# Patient Record
Sex: Female | Born: 1985 | Race: Black or African American | Hispanic: No | Marital: Married | Smoking: Never smoker
Health system: Southern US, Community
[De-identification: ages and names within clinical notes are randomized; demographics above are authoritative.]

## PROBLEM LIST (undated history)

## (undated) ENCOUNTER — Inpatient Hospital Stay (HOSPITAL_COMMUNITY): Payer: Self-pay

## (undated) DIAGNOSIS — D649 Anemia, unspecified: Secondary | ICD-10-CM

## (undated) DIAGNOSIS — E282 Polycystic ovarian syndrome: Secondary | ICD-10-CM

## (undated) HISTORY — DX: Anemia, unspecified: D64.9

## (undated) HISTORY — PX: NO PAST SURGERIES: SHX2092

---

## 2010-12-23 ENCOUNTER — Inpatient Hospital Stay (HOSPITAL_COMMUNITY)
Admission: AD | Admit: 2010-12-23 | Discharge: 2010-12-23 | Disposition: A | Discharge status: Home / Self Care | Payer: Self-pay | Source: Ambulatory Visit | Attending: Obstetrics & Gynecology | Admitting: Obstetrics & Gynecology

## 2010-12-23 ENCOUNTER — Encounter (HOSPITAL_COMMUNITY): Payer: Self-pay | Admitting: *Deleted

## 2010-12-23 DIAGNOSIS — N915 Oligomenorrhea, unspecified: Secondary | ICD-10-CM

## 2010-12-23 NOTE — ED Provider Notes (Signed)
History   Jordan Holt is a 25 y.o. year old G0P0 female who presents to [facility]U reporting oligomenorrhea x 2 years, LMP 10/06/10. Denies n/v, breast tenderness, Is sexually active w/out consistent birth control. Denies unexpected weight change, acne, hirsutism, Hx ovarian cysts.   CSN: [id number] Arrival date & time: 12/23/2010  2:32 PM   None     Chief Complaint  Patient presents with  . Possible Pregnancy    (Consider location/radiation/quality/duration/timing/severity/associated sxs/prior treatment) HPI  Past Medical History  Diagnosis Date  . No pertinent past medical history     Past Surgical History  Procedure Date  . No past surgeries     No family history on file.  History  Substance Use Topics  . Smoking status: Never Smoker   . Smokeless tobacco: Not on file  . Alcohol Use: No    OB History    Grav Para Term Preterm Abortions TAB SAB Ect Mult Living   0               Review of Systems: Otherwise neg  Allergies  Review of patient's allergies indicates no known allergies.  Home Medications  No current outpatient prescriptions on file.  BP 107/73  Pulse 73  Temp(Src) 97.7 F (36.5 C) (Oral)  Resp 18  Ht 5\' 6"  (1.676 m)  Wt 71.215 kg (157 lb)  BMI 25.34 kg/m2  SpO2 99%  LMP 10/06/2010  Physical Exam UPT: neg ED Course  Procedures (including critical care time)   MDM  Assessment: Oligomenorrhea of unknown etiology  F/U w/ Gyn further eval.  Katai Marsico 12/23/10 8:46

## 2010-12-23 NOTE — Progress Notes (Signed)
Denies GI or GU problems  

## 2010-12-23 NOTE — Progress Notes (Signed)
No menses for 3 months.  Has not done test.  Hx of this previously.

## 2011-01-05 NOTE — ED Provider Notes (Signed)
Attestation of Attending Supervision of Advanced Practitioner: Evaluation and management procedures were performed by the PA/NP/CNM/OB Fellow under my supervision/collaboration. Chart reviewed, and agree with management and plan.  Ewen Varnell, M.D. 01/05/2011 8:33 AM     

## 2011-03-24 ENCOUNTER — Inpatient Hospital Stay (HOSPITAL_COMMUNITY): Payer: Self-pay

## 2011-03-24 ENCOUNTER — Inpatient Hospital Stay (HOSPITAL_COMMUNITY)
Admission: AD | Admit: 2011-03-24 | Discharge: 2011-03-24 | Disposition: A | Discharge status: Home / Self Care | Payer: Self-pay | Source: Ambulatory Visit | Attending: Obstetrics & Gynecology | Admitting: Obstetrics & Gynecology

## 2011-03-24 ENCOUNTER — Emergency Department (INDEPENDENT_AMBULATORY_CARE_PROVIDER_SITE_OTHER)
Admission: EM | Admit: 2011-03-24 | Discharge: 2011-03-24 | Disposition: A | Discharge status: Nursing Home (Includes ICF) | Payer: Self-pay | Source: Home / Self Care | Attending: Emergency Medicine | Admitting: Emergency Medicine

## 2011-03-24 ENCOUNTER — Encounter (HOSPITAL_COMMUNITY): Payer: Self-pay | Admitting: *Deleted

## 2011-03-24 DIAGNOSIS — N898 Other specified noninflammatory disorders of vagina: Secondary | ICD-10-CM

## 2011-03-24 DIAGNOSIS — N939 Abnormal uterine and vaginal bleeding, unspecified: Secondary | ICD-10-CM

## 2011-03-24 DIAGNOSIS — D649 Anemia, unspecified: Secondary | ICD-10-CM | POA: Insufficient documentation

## 2011-03-24 DIAGNOSIS — O4690 Antepartum hemorrhage, unspecified, unspecified trimester: Secondary | ICD-10-CM

## 2011-03-24 DIAGNOSIS — N949 Unspecified condition associated with female genital organs and menstrual cycle: Secondary | ICD-10-CM | POA: Insufficient documentation

## 2011-03-24 DIAGNOSIS — N938 Other specified abnormal uterine and vaginal bleeding: Secondary | ICD-10-CM | POA: Insufficient documentation

## 2011-03-24 LAB — POCT PREGNANCY, URINE: Preg Test, Ur: POSITIVE — AB

## 2011-03-24 LAB — WET PREP, GENITAL: Yeast Wet Prep HPF POC: NONE SEEN

## 2011-03-24 LAB — CBC
HCT: 28.4 % — ABNORMAL LOW (ref 36.0–46.0)
Hemoglobin: 9 g/dL — ABNORMAL LOW (ref 12.0–15.0)
MCH: 23.1 pg — ABNORMAL LOW (ref 26.0–34.0)
MCHC: 31.7 g/dL (ref 30.0–36.0)
MCV: 73 fL — ABNORMAL LOW (ref 78.0–100.0)

## 2011-03-24 NOTE — Discharge Instructions (Signed)
Go to [facility] hospital on [address] They are expecting you. It is important to find out how far along you are in your pregnancy and to confirm that the pregnancy is in the right place. You will also need additional blood work. Go there today.   Vaginal Bleeding During Pregnancy A small amount of bleeding from the vagina can happen anytime during pregnancy. Be sure to tell your doctor about all vaginal bleeding.  HOME CARE  Get plenty of rest and sleep.   Count the number of pads you use each day. Do not use tampons.   Save any tissue you pass for your doctor to see.   Do not exercise   Do not do any heavy lifting.   Avoid going up and down stairs. If you must climb stairs, go slowly.   Do not have sex (intercourse) or orgasms until approved by your doctor.   Do not douche.   Only take medicine as told by your doctor. Do not take aspirin.   Eat healthy.   Always keep your follow-up appointments.  GET HELP RIGHT AWAY IF:   You feel the baby moving less or not moving at all.   The bleeding gets worse.   You have very painful cramps or pain in your stomach or back.   You pass large clots or anything that looks like tissue.   You have a temperature by mouth above 102 F (38.9 C).   You feel very weak.   You have chills.   You feel dizzy or pass out (faint).   You have a gush of fluid from the vagina.  MAKE SURE YOU:   Understand these instructions.   Will watch your condition.   Will get help right away if you are not doing well or get worse.  Document Released: 10/06/2007 Document Revised: 12/16/2010 Document Reviewed: 12/02/2008 [facility] Patient Information 2012 [place], [place].

## 2011-03-24 NOTE — MAU Provider Note (Signed)
Attestation of Attending Supervision of Advanced Practitioner: Evaluation and management procedures were performed by the PA/NP/CNM/OB Fellow under my supervision/collaboration. Chart reviewed, and agree with management and plan.  Jaynie Collins, M.D. 03/24/2011 11:00 PM

## 2011-03-24 NOTE — ED Notes (Signed)
Pt  Reports  History  Of  irreg  Periods     She  Reports  Vaginal bleeding  For  Last  2  Weeks      -  She     Reports     She   Has  Taken  bcp  In past  But has  Not  Taken any   Recently   -   She  Reports     That      She      Has not  Had  A  Period   Since   Sept    -  She is  Awake  As  Well  As  Alert  And  Oriented     Her  Skin is  Warm /  Dry    She  denys  Any pain       She   Was  Ambulatory    To  Exam room   With  Steady  Upright  Gait

## 2011-03-24 NOTE — MAU Note (Signed)
On going bleeding. Started about 2-3wks ago.   Hx of irregular cycles, no period since Sept.

## 2011-03-24 NOTE — ED Provider Notes (Signed)
History     CSN: [id number]  Arrival date & time 03/24/11  1252   First MD Initiated Contact with Patient 03/24/11 1340      Chief Complaint  Patient presents with  . Vaginal Bleeding    (Consider location/radiation/quality/duration/timing/severity/associated sxs/prior treatment) HPI Comments: Patient reports painless, increasingly heavy vaginal bleeding for 3 weeks. States that she is now going through 4 pads a day for the past 4 or 5 days. No abdominal pain, chest pain, palpitations, shortness of breath, dizziness, presyncope, syncope. No urinary complaints. Patient is sexually active with her husband. They do not use any birth control. Patient has never been pregnant previously. Patient has a 2 year history of irregular menses, and states that her last normal period was in September 2012. [facility] with complaints of amenorrhea, urine pregnancy was negative. No history of gonorrhea, Chlamydia, herpes, HIV, syphilis, PID.    Patient is a 26 y.o. female presenting with vaginal bleeding. The history is provided by the patient.  Vaginal Bleeding This is a new problem. The current episode started more than 1 week ago. The problem occurs constantly. The problem has been gradually worsening. Pertinent negatives include no chest pain, no abdominal pain and no shortness of breath. The symptoms are aggravated by nothing. The symptoms are relieved by nothing. The treatment provided no relief.    Past Medical History  Diagnosis Date  . No pertinent past medical history     Past Surgical History  Procedure Date  . No past surgeries     History reviewed. No pertinent family history.  History  Substance Use Topics  . Smoking status: Never Smoker   . Smokeless tobacco: Not on file  . Alcohol Use: No    OB History    Grav Para Term Preterm Abortions TAB SAB Ect Mult Living   1               Review of Systems  Constitutional: Negative for fatigue.    Respiratory: Negative for shortness of breath.   Cardiovascular: Negative for chest pain.  Gastrointestinal: Negative for abdominal pain.  Genitourinary: Positive for vaginal bleeding.  Skin: Negative for pallor.  Neurological: Negative for syncope and weakness.    Allergies  Review of patient's allergies indicates no known allergies.  Home Medications  No current outpatient prescriptions on file.  BP 99/54  Pulse 95  Temp(Src) 97.6 F (36.4 C) (Oral)  Resp 16  SpO2 100%  LMP 09/24/2010  Physical Exam  Nursing note and vitals reviewed. Constitutional: She is oriented to person, place, and time. She appears well-developed and well-nourished. No distress.  HENT:  Head: Normocephalic and atraumatic.  Eyes: Conjunctivae and EOM are normal.  Neck: Normal range of motion.  Cardiovascular: Normal rate.   Pulmonary/Chest: Effort normal.  Abdominal: Soft. Bowel sounds are normal. She exhibits no distension and no mass. There is no tenderness. There is no rebound and no guarding.       No palpable uterus  Musculoskeletal: Normal range of motion.  Neurological: She is alert and oriented to person, place, and time.  Skin: Skin is warm and dry.  Psychiatric: She has a normal mood and affect. Her behavior is normal. Judgment and thought content normal.    ED Course  Procedures (including critical care time)  Labs Reviewed  POCT PREGNANCY, URINE - Abnormal; Notable for the following:    Preg Test, Ur POSITIVE (*)    All other components within normal limits  No results found.   1. Vaginal bleeding in pregnancy       MDM  Previous labs, records reviewed. As noted in history of present illness.  Discussed pregnancy findings with patient and husband. Discussed need for transfer to confirm intrauterine pregnancy. Emphasized importance of having this done today. Discussed case with [facility]. They will see her when she arrives.  Domenick Gong, MD 03/24/11 [phone]

## 2011-03-24 NOTE — Discharge Instructions (Signed)
Iron Deficiency Anemia  Anemia is when you have a low number of healthy red blood cells. HOME CARE   Ask your doctor or dietician what foods you should eat.   Take iron and vitamins as told by your doctor.   Eat foods that have iron in them. This includes liver, lean beef, whole-grain bread, eggs, dried fruit, and dark green leafy vegetables.  GET HELP RIGHT AWAY IF:  You pass out (faint).   You have chest pain.   You feel sick to your stomach (nauseous) or throw up (vomit).   You get very short of breath with activity.   You are weak.   You are thirstier than normal.   You have a fast heartbeat.   You start to sweat or become lightheaded when getting up from a chair or bed.  MAKE SURE YOU:  Understand these instructions.   Will watch your condition.   Will get help right away if you are not doing well or get worse.  Document Released: 01/29/2010 Document Revised: 12/16/2010 Document Reviewed: 01/29/2010 [facility] Patient Information 2012 [place], [place].Abnormal Vaginal Bleeding Abnormal vaginal bleeding means bleeding from the vagina that is not your normal menstrual period. Bleeding may be heavy or light. It may last for days or come and go. There are many problems that may cause this. HOME CARE  Keep track of your periods on a calendar if they are not regular.   Write down:   How often pads or tampons are changed.   The size and number of clots, if there are any.   A change in the color of the blood.   A change in the amount of blood.   Any smell.   The time and strength of cramps or pain.   Limit activity as told.   Eat a healthy diet.   Do not have sex (intercourse) until your doctor says it is okay.   Never have unprotected sex unless you are trying to get pregnant.   Only take medicine as told by your doctor.  GET HELP RIGHT AWAY IF:   You get dizzy or feel faint when standing up.   You have to change pads or tampons more than once an hour.     You feel a sudden change in your pain.   You start bleeding heavily.   You develop a fever.  MAKE SURE YOU:  Understand these instructions.   Will watch your condition.   Will get help right away if you are not doing well or get worse.  Document Released: 10/24/2008 Document Revised: 12/16/2010 Document Reviewed: 10/24/2008 [facility] Patient Information 2012 [place], [place].

## 2011-03-24 NOTE — MAU Provider Note (Signed)
  History     CSN: [id number]  Arrival date and time: 03/24/11 1648     Chief Complaint  Patient presents with  . Vaginal Bleeding   HPI Pt has a positive UPT and presents with vaginal bleeding.  Pt has a history of irregular periods.    Past Medical History  Diagnosis Date  . No pertinent past medical history     Past Surgical History  Procedure Date  . No past surgeries     No family history on file.  History  Substance Use Topics  . Smoking status: Never Smoker   . Smokeless tobacco: Not on file  . Alcohol Use: No    Allergies: No Known Allergies  No prescriptions prior to admission    ROS Physical Exam   Blood pressure 133/73, pulse 102, temperature 98.4 F (36.9 C), temperature source Oral, resp. rate 20, height 5\' 7"  (1.702 m), weight 160 lb (72.576 kg), last menstrual period 09/24/2010, SpO2 100.00%.  Physical Exam  MAU Course  Procedures Results for orders placed during the hospital encounter of 03/24/11 (from the past 24 hour(s))  CBC     Status: Abnormal   Collection Time   03/24/11  5:50 PM      Component Value Range   WBC 8.0  4.0 - 10.5 (K/uL)   RBC 3.89  3.87 - 5.11 (MIL/uL)   Hemoglobin 9.0 (*) 12.0 - 15.0 (g/dL)   HCT 14.7 (*) 82.9 - 46.0 (%)   MCV 73.0 (*) 78.0 - 100.0 (fL)   MCH 23.1 (*) 26.0 - 34.0 (pg)   MCHC 31.7  30.0 - 36.0 (g/dL)   RDW 56.2 (*) 13.0 - 15.5 (%)   Platelets 369  150 - 400 (K/uL)  HCG, QUANTITATIVE, PREGNANCY     Status: Normal   Collection Time   03/24/11  5:50 PM      Component Value Range   hCG, Beta Chain, Quant, S <1  <5 (mIU/mL)  ABO/RH     Status: Normal   Collection Time   03/24/11  5:50 PM      Component Value Range   ABO/RH(D) O POS    orders initiated while pt in waiting room due to long wait to see provider; pt not in room at end of my shift and care handed over to next provider. **Note pt had 2 positive home urine pregnancy tests and positive urine pregnancy test in MAU and therefore was worked  up for ectopic before Quant Beta HCG was completed, which showed <1 mIU/mL  Assessment and Plan    Jordan Holt 03/24/2011, 8:46 PM

## 2011-03-24 NOTE — MAU Provider Note (Signed)
History     CSN: [id number]  Arrival date and time: 03/24/11 1648   First Provider Initiated Contact with Patient 03/24/11 2137      Chief Complaint  Patient presents with  . Vaginal Bleeding   HPI Jordan Holt is 26 y.o. G1P0 Unknown weeks presenting with bleeding in early pregnancy.  LMP  2/25 and continues to bleeding.  Went to Urgent Care today and told her UPT is postive and called here for further evaluation.  One sexual partner. Denies vaginal odor.  Has mild cramping.  Not using contraception    Past Medical History  Diagnosis Date  . No pertinent past medical history     Past Surgical History  Procedure Date  . No past surgeries     No family history on file.  History  Substance Use Topics  . Smoking status: Never Smoker   . Smokeless tobacco: Not on file  . Alcohol Use: No    Allergies: No Known Allergies  No prescriptions prior to admission    Review of Systems  Constitutional: Negative.   Gastrointestinal: Positive for abdominal pain (mild cramps).  Genitourinary:       + for vaginal bleeding  Neurological: Negative for headaches.   Physical Exam   Blood pressure 133/73, pulse 102, temperature 98.4 F (36.9 C), temperature source Oral, resp. rate 20, height 5\' 7"  (1.702 m), weight 160 lb (72.576 kg), last menstrual period 09/24/2010, SpO2 100.00%.  Physical Exam  Constitutional: She is oriented to person, place, and time. She appears well-developed and well-nourished. No distress.  HENT:  Head: Normocephalic.  Neck: Normal range of motion.  Respiratory: Effort normal.  GI: Soft. She exhibits no distension and no mass. There is no tenderness. There is no rebound and no guarding.  Genitourinary: Uterus is not enlarged and not tender. Cervix exhibits no friability. Right adnexum displays no mass, no tenderness and no fullness. Left adnexum displays no mass, no tenderness and no fullness. There is bleeding (small amount of bleeding without clots)  around the vagina. No vaginal discharge found.  Neurological: She is alert and oriented to person, place, and time.  Skin: Skin is warm and dry.   Ultrasound results:  NO IUP or adnexal mass.  Uterus is normal size and shape. Results for orders placed during the hospital encounter of 03/24/11 (from the past 24 hour(s))  CBC     Status: Abnormal   Collection Time   03/24/11  5:50 PM      Component Value Range   WBC 8.0  4.0 - 10.5 (K/uL)   RBC 3.89  3.87 - 5.11 (MIL/uL)   Hemoglobin 9.0 (*) 12.0 - 15.0 (g/dL)   HCT 40.9 (*) 81.1 - 46.0 (%)   MCV 73.0 (*) 78.0 - 100.0 (fL)   MCH 23.1 (*) 26.0 - 34.0 (pg)   MCHC 31.7  30.0 - 36.0 (g/dL)   RDW 91.4 (*) 78.2 - 15.5 (%)   Platelets 369  150 - 400 (K/uL)  HCG, QUANTITATIVE, PREGNANCY     Status: Normal   Collection Time   03/24/11  5:50 PM      Component Value Range   hCG, Beta Chain, Quant, S <1  <5 (mIU/mL)  ABO/RH     Status: Normal   Collection Time   03/24/11  5:50 PM      Component Value Range   ABO/RH(D) O POS    WET PREP, GENITAL     Status: Abnormal   Collection Time  03/24/11  9:50 PM      Component Value Range   Yeast Wet Prep HPF POC NONE SEEN  NONE SEEN    Trich, Wet Prep NONE SEEN  NONE SEEN    Clue Cells Wet Prep HPF POC RARE (*) NONE SEEN    WBC, Wet Prep HPF POC FEW (*) NONE SEEN     MAU Course  Procedures  GC/CHL to lab  MDM   Assessment and Plan  A:  Abnormal vaginal bleeding    Anemia   P:  Patient does not want a pregnancy, suggested she see clinic or MD of her choice to discuss contraception      You need to begin an over the counter iron supplement 1 tablet a day   Kinta Martis,EVE M 03/24/2011, 9:38 PM

## 2011-03-24 NOTE — Plan of Care (Signed)
Sent from urgent care (irreg cycles) prolonged bleeding, pt thought period, +preg test at urgent care.  Sent here for further eval.

## 2011-03-25 LAB — GC/CHLAMYDIA PROBE AMP, GENITAL
Chlamydia, DNA Probe: NEGATIVE
GC Probe Amp, Genital: NEGATIVE

## 2011-03-28 NOTE — MAU Provider Note (Signed)
Attestation of Attending Supervision of Advanced Practitioner: Evaluation and management procedures were performed by the PA/NP/CNM/OB Fellow under my supervision/collaboration. Chart reviewed, and agree with management and plan.  Siraj Dermody, M.D. 03/28/2011 9:43 AM   

## 2012-01-11 NOTE — L&D Delivery Note (Signed)
Delivery Note At 2:29 AM a viable female was delivered via Vaginal, Spontaneous Delivery (Presentation: Left Occiput Anterior).  APGAR: 8-9 , ; weight: 2145 grams .   Placenta status: Intact, Spontaneous.  Cord: 3 vessels with the following complications: None.  Cord pH: none  Anesthesia: Local  Episiotomy:  None Lacerations: 2nd Degree Suture Repair: 2.0 chromic Est. Blood Loss (mL): 400  Mom to postpartum.  Baby to Couplet care / Skin to Skin.  Jordan Holt A 12/19/2012, 2:59 AM

## 2012-02-17 ENCOUNTER — Inpatient Hospital Stay (HOSPITAL_COMMUNITY): Payer: BC Managed Care – PPO

## 2012-02-17 ENCOUNTER — Inpatient Hospital Stay (HOSPITAL_COMMUNITY)
Admission: AD | Admit: 2012-02-17 | Discharge: 2012-02-17 | Disposition: A | Discharge status: Home / Self Care | Payer: BC Managed Care – PPO | Source: Ambulatory Visit | Attending: Obstetrics & Gynecology | Admitting: Obstetrics & Gynecology

## 2012-02-17 ENCOUNTER — Encounter (HOSPITAL_COMMUNITY): Payer: Self-pay | Admitting: *Deleted

## 2012-02-17 DIAGNOSIS — N938 Other specified abnormal uterine and vaginal bleeding: Secondary | ICD-10-CM | POA: Insufficient documentation

## 2012-02-17 DIAGNOSIS — N949 Unspecified condition associated with female genital organs and menstrual cycle: Secondary | ICD-10-CM | POA: Diagnosis present

## 2012-02-17 LAB — URINALYSIS, ROUTINE W REFLEX MICROSCOPIC
Glucose, UA: NEGATIVE mg/dL
Ketones, ur: NEGATIVE mg/dL
Leukocytes, UA: NEGATIVE
Protein, ur: NEGATIVE mg/dL

## 2012-02-17 LAB — CBC
HCT: 29.2 % — ABNORMAL LOW (ref 36.0–46.0)
MCHC: 28.8 g/dL — ABNORMAL LOW (ref 30.0–36.0)
MCV: 60.7 fL — ABNORMAL LOW (ref 78.0–100.0)
RDW: 20.9 % — ABNORMAL HIGH (ref 11.5–15.5)

## 2012-02-17 LAB — WET PREP, GENITAL

## 2012-02-17 LAB — URINE MICROSCOPIC-ADD ON: WBC, UA: NONE SEEN WBC/hpf (ref <3)

## 2012-02-17 LAB — POCT PREGNANCY, URINE: Preg Test, Ur: NEGATIVE

## 2012-02-17 MED ORDER — FERROUS SULFATE 325 (65 FE) MG PO TABS: 325.0000 mg | ORAL_TABLET | Freq: Every day | ORAL | Status: DC

## 2012-02-17 MED ORDER — DOCUSATE SODIUM 100 MG PO CAPS: 100.0000 mg | ORAL_CAPSULE | Freq: Two times a day (BID) | ORAL | Status: DC | PRN

## 2012-02-17 MED ORDER — MEDROXYPROGESTERONE ACETATE 10 MG PO TABS: 10.0000 mg | ORAL_TABLET | Freq: Every day | ORAL | Status: DC

## 2012-02-17 NOTE — MAU Provider Note (Signed)
History     CSN: [id number]  Arrival date and time: 02/17/12 1626   First Provider Initiated Contact with Patient 02/17/12 1734      Chief Complaint  Patient presents with  . Vaginal Bleeding   HPI [name] is a 27 y.o. G0P0 who presents today with vaginal bleeding x 3-4 weeks. The patient states that the bleeding has been like a normal period and sometimes lighter. She does not have any pelvic pain, she denies fever, N/V. The patient had a similar episode in March 2013 and never had follow-up in GYN clinic. At that time the patient had had a positive UPT at Urgent Care. She was told after evaluation here that she had had a SAB. She denies dizziness, weakness of fatigue.   OB History    Grav Para Term Preterm Abortions TAB SAB Ect Mult Living   0               Past Medical History  Diagnosis Date  . No pertinent past medical history     Past Surgical History  Procedure Date  . No past surgeries     History reviewed. No pertinent family history.  History  Substance Use Topics  . Smoking status: Never Smoker   . Smokeless tobacco: Not on file  . Alcohol Use: No    Allergies: No Known Allergies  No prescriptions prior to admission    Review of Systems  Constitutional: Negative for fever and chills.  Gastrointestinal: Negative for nausea, vomiting and abdominal pain.  Genitourinary:       + vaginal bleeding   Physical Exam   Blood pressure 124/72, pulse 85, temperature 97.9 F (36.6 C), temperature source Oral, resp. rate 18, height 5\' 7"  (1.702 m), weight 158 lb (71.668 kg), last menstrual period 12/31/2011, unknown if currently breastfeeding.  Physical Exam  Constitutional: She is oriented to person, place, and time. She appears well-developed and well-nourished. No distress.  HENT:  Head: Normocephalic.  Cardiovascular: Normal rate, regular rhythm and normal heart sounds.   Respiratory: Effort normal and breath sounds normal. No respiratory  distress.  GI: Soft. Bowel sounds are normal. She exhibits no distension and no mass. There is no tenderness. There is no rebound and no guarding.  Genitourinary: Vagina normal. Uterus is not enlarged and not tender. Cervix exhibits discharge (moderate amount of dark red blood at the cervical os and in the vaginal vault). Cervix exhibits no motion tenderness and no friability. Right adnexum displays no mass and no tenderness. Left adnexum displays no mass and no tenderness.  Neurological: She is alert and oriented to person, place, and time.  Skin: Skin is warm and dry. No erythema.  Psychiatric: She has a normal mood and affect.   Results for orders placed during the hospital encounter of 02/17/12 (from the past 24 hour(s))  URINALYSIS, ROUTINE W REFLEX MICROSCOPIC     Status: Abnormal   Collection Time   02/17/12  4:50 PM      Component Value Range   Color, Urine YELLOW  YELLOW   APPearance CLEAR  CLEAR   Specific Gravity, Urine 1.010  1.005 - 1.030   pH 5.5  5.0 - 8.0   Glucose, UA NEGATIVE  NEGATIVE mg/dL   Hgb urine dipstick LARGE (*) NEGATIVE   Bilirubin Urine NEGATIVE  NEGATIVE   Ketones, ur NEGATIVE  NEGATIVE mg/dL   Protein, ur NEGATIVE  NEGATIVE mg/dL   Urobilinogen, UA 0.2  0.0 - 1.0 mg/dL  Nitrite NEGATIVE  NEGATIVE   Leukocytes, UA NEGATIVE  NEGATIVE  URINE MICROSCOPIC-ADD ON     Status: Normal   Collection Time   02/17/12  4:50 PM      Component Value Range   Squamous Epithelial / LPF RARE  RARE   WBC, UA    <3 WBC/hpf   Value: NO FORMED ELEMENTS SEEN ON URINE MICROSCOPIC EXAMINATION   RBC / HPF 11-20  <3 RBC/hpf   Bacteria, UA RARE  RARE  POCT PREGNANCY, URINE     Status: Normal   Collection Time   02/17/12  5:13 PM      Component Value Range   Preg Test, Ur NEGATIVE  NEGATIVE  CBC     Status: Abnormal   Collection Time   02/17/12  5:27 PM      Component Value Range   WBC 7.8  4.0 - 10.5 K/uL   RBC 4.81  3.87 - 5.11 MIL/uL   Hemoglobin 8.4 (*) 12.0 - 15.0 g/dL    HCT 16.1 (*) 09.6 - 46.0 %   MCV 60.7 (*) 78.0 - 100.0 fL   MCH 17.5 (*) 26.0 - 34.0 pg   MCHC 28.8 (*) 30.0 - 36.0 g/dL   RDW 04.5 (*) 40.9 - 81.1 %   Platelets 469 (*) 150 - 400 K/uL  WET PREP, GENITAL     Status: Abnormal   Collection Time   02/17/12  5:37 PM      Component Value Range   Yeast Wet Prep HPF POC NONE SEEN  NONE SEEN   Trich, Wet Prep NONE SEEN  NONE SEEN   Clue Cells Wet Prep HPF POC NONE SEEN  NONE SEEN   WBC, Wet Prep HPF POC FEW (*) NONE SEEN   *RADIOLOGY REPORT*   Clinical Data: Dysfunctional uterine bleeding   TRANSABDOMINAL AND TRANSVAGINAL ULTRASOUND OF PELVIS   Technique: Both transabdominal and transvaginal ultrasound  examinations of the pelvis were performed. Transabdominal technique  was performed for global imaging of the pelvis including uterus,  ovaries, adnexal regions, and pelvic cul-de-sac.  It was necessary to proceed with endovaginal exam following the  transabdominal exam to visualize the endometrium.  Comparison: None.   Findings:  Uterus: Normal in size and appearance, measuring 8.8 x 4.1 x 4.6  cm.  Endometrium: Mildly thickened/heterogeneous, measuring 19 mm. No  definite focal mass is seen. Mild associated vascularity.  Right ovary: Normal appearance/no adnexal mass, measuring 5.2 x  2.6 x 2.2 cm.  Left ovary: Normal appearance/no adnexal mass, measuring 4.1 x 2.7  x 2.4 cm.  Other findings: No free fluid.   IMPRESSION:  Endometrium is mildly thickened/heterogeneous, measuring 19 mm.  If bleeding remains unresponsive to hormonal or medical therapy,  focal lesion work-up with sonohysterogram should be considered.  Endometrial biopsy should also be considered in pre-menopausal  patients at high risk for endometrial carcinoma.   (Ref: Radiological Reasoning: Algorithmic Workup of Abnormal  Vaginal Bleeding with Endovaginal Sonography and Sonohysterography.  AJR 2008; [id number]).   Original Report Authenticated By: Charline Bills, M.D.  MAU Course  Procedures None  Assessment and Plan  A: Dysfunctional Uterine Bleeding  P: Discharge home Rx for Provera, Iron and Colace sent to patient's pharmacy Referral to clinic for follow-up  Patient may return to MAU as needed or if her condition should change or worsen  Freddi Starr, PA-C 02/17/2012, 7:31 PM

## 2012-02-17 NOTE — MAU Note (Signed)
Period started 3-4 wks ago and it is not stopping. Came in Dec on the 7 like normal, then stopped a couple wks later it started and has continued.  Not heavy just persistant.

## 2012-02-18 LAB — GC/CHLAMYDIA PROBE AMP: GC Probe RNA: NEGATIVE

## 2012-03-09 ENCOUNTER — Ambulatory Visit (INDEPENDENT_AMBULATORY_CARE_PROVIDER_SITE_OTHER): Payer: BC Managed Care – PPO | Admitting: Medical

## 2012-03-09 ENCOUNTER — Encounter: Payer: Self-pay | Admitting: Medical

## 2012-03-09 VITALS — BP 117/71 | HR 84 | Temp 97.3°F | Ht 67.0 in | Wt 153.8 lb

## 2012-03-09 DIAGNOSIS — N979 Female infertility, unspecified: Secondary | ICD-10-CM

## 2012-03-09 DIAGNOSIS — N938 Other specified abnormal uterine and vaginal bleeding: Secondary | ICD-10-CM

## 2012-03-09 LAB — CBC
HCT: 31 % — ABNORMAL LOW (ref 36.0–46.0)
Hemoglobin: 9.2 g/dL — ABNORMAL LOW (ref 12.0–15.0)
MCV: 65.4 fL — ABNORMAL LOW (ref 78.0–100.0)
RDW: 27.7 % — ABNORMAL HIGH (ref 11.5–15.5)
WBC: 6.4 10*3/uL (ref 4.0–10.5)

## 2012-03-09 MED ORDER — TRANEXAMIC ACID 650 MG PO TABS: 1300.0000 mg | ORAL_TABLET | Freq: Three times a day (TID) | ORAL | Status: DC

## 2012-03-09 MED ORDER — FERROUS SULFATE 325 (65 FE) MG PO TABS: 325.0000 mg | ORAL_TABLET | Freq: Every day | ORAL | Status: DC

## 2012-03-09 NOTE — Progress Notes (Signed)
Appointment made for [facility] 03/14/12 Dr. Zeb Comfort faxed via epic

## 2012-03-09 NOTE — Progress Notes (Signed)
Patient ID: Jordan Holt, female   DOB: 1985-06-02, 27 y.o.   MRN: [id number]  History:  Jordan Holt  is a 27 y.o. G0P0 who presents to clinic today for abnormal uterine bleeding and difficulty with fertility. The patient was seen in MAU within the last month and had an [place] as seen below. The patient was given Provera at the time which she took for 10 days. This stopped her bleeding. The bleeding started again about 5 days ago and after 3 days of bleeding she decided to take the Provera again to stop the bleeding. She has not had bleeding since Wednesday of this week. The patient states that they have been trying to conceive since at least July of last year if not longer.    The following portions of the patient's history were reviewed and updated as appropriate: allergies, current medications, past family history, past medical history, past social history, past surgical history and problem list.  Review of Systems:  Pertinent items are noted in HPI.  Objective:  Physical Exam BP 117/71  Pulse 84  Temp(Src) 97.3 F (36.3 C) (Oral)  Ht 5\' 7"  (1.702 m)  Wt 153 lb 12.8 oz (69.763 kg)  BMI 24.08 kg/m2  LMP 03/07/2012 GENERAL: Well-developed, well-nourished female in no acute distress.  HEENT: Normocephalic, atraumatic.  LUNGS: Normal rate. Clear to auscultation bilaterally.  HEART: Regular rate and rhythm with no adventitious sounds.  ABDOMEN: Soft, nontender, nondistended. No organomegaly. EXTREMITIES: No cyanosis, clubbing, or edema.   Labs and Imaging US Transvaginal Non-ob  02/17/2012  *RADIOLOGY REPORT*  Clinical Data: Dysfunctional uterine bleeding  TRANSABDOMINAL AND TRANSVAGINAL ULTRASOUND OF PELVIS Technique:  Both transabdominal and transvaginal ultrasound examinations of the pelvis were performed. Transabdominal technique was performed for global imaging of the pelvis including uterus, ovaries, adnexal regions, and pelvic cul-de-sac.  It was necessary to proceed with  endovaginal exam following the transabdominal exam to visualize the endometrium.  Comparison:  None.  Findings:  Uterus: Normal in size and appearance, measuring 8.8 x 4.1 x 4.6 cm.  Endometrium: Mildly thickened/heterogeneous, measuring 19 mm.  No definite focal mass is seen.  Mild associated vascularity.  Right ovary:  Normal appearance/no adnexal mass, measuring 5.2 x 2.6 x 2.2 cm.  Left ovary: Normal appearance/no adnexal mass, measuring 4.1 x 2.7 x 2.4 cm.  Other findings: No free fluid.  IMPRESSION: Endometrium is mildly thickened/heterogeneous, measuring 19 mm.  If bleeding remains unresponsive to hormonal or medical therapy, focal lesion work-up with sonohysterogram should be considered. Endometrial biopsy should also be considered in pre-menopausal patients at high risk for endometrial carcinoma.  (Ref:  Radiological Reasoning: Algorithmic Workup of Abnormal Vaginal Bleeding with Endovaginal Sonography and Sonohysterography. AJR 2008; 782:N56-21).   Original Report Authenticated By: Charline Bills, M.D.     Assessment & Plan:  Assessment: Abnormal uterine bleeding Desires fertility  Plans: CBC today Rx for iron and Lysteda given to patient Referral to Dr. April Manson for infertility management  Freddi Starr, PA-C 03/09/2012 11:02 AM

## 2012-03-09 NOTE — Patient Instructions (Signed)
Dysfunctional Uterine Bleeding  Normally, menstrual periods begin between ages 11 to 17 in young women. A normal menstrual cycle/period may begin every 23 days up to 35 days and lasts from 1 to 7 days. Around 12 to 14 days before your menstrual period starts, ovulation (ovary produces an egg) occurs. When counting the time between menstrual periods, count from the first day of bleeding of the previous period to the first day of bleeding of the next period.  Dysfunctional (abnormal) uterine bleeding is bleeding that is different from a normal menstrual period. Your periods may come earlier or later than usual. They may be lighter, have blood clots or be heavier. You may have bleeding between periods, or you may skip one period or more. You may have bleeding after sexual intercourse, bleeding after menopause, or no menstrual period.  CAUSES   · Pregnancy (normal, miscarriage, tubal).  · IUDs (intrauterine device, birth control).  · Birth control pills.  · Hormone treatment.  · Menopause.  · Infection of the cervix.  · Blood clotting problems.  · Infection of the inside lining of the uterus.  · Endometriosis, inside lining of the uterus growing in the pelvis and other female organs.  · Adhesions (scar tissue) inside the uterus.  · Obesity or severe weight loss.  · Uterine polyps inside the uterus.  · Cancer of the vagina, cervix, or uterus.  · Ovarian cysts or polycystic ovary syndrome.  · Medical problems (diabetes, thyroid disease).  · Uterine fibroids (noncancerous tumor).  · Problems with your female hormones.  · Endometrial hyperplasia, very thick lining and enlarged cells inside of the uterus.  · Medicines that interfere with ovulation.  · Radiation to the pelvis or abdomen.  · Chemotherapy.  DIAGNOSIS   · Your doctor will discuss the history of your menstrual periods, medicines you are taking, changes in your weight, stress in your life, and any medical problems you may have.  · Your doctor will do a physical  and pelvic examination.  · Your doctor may want to perform certain tests to make a diagnosis, such as:  · Pap test.  · Blood tests.  · Cultures for infection.  · CT scan.  · Ultrasound.  · Hysteroscopy.  · Laparoscopy.  · MRI.  · Hysterosalpingography.  · D and C.  · Endometrial biopsy.  TREATMENT   Treatment will depend on the cause of the dysfunctional uterine bleeding (DUB). Treatment may include:  · Observing your menstrual periods for a couple of months.  · Prescribing medicines for medical problems, including:  · Antibiotics.  · Hormones.  · Birth control pills.  · Removing an IUD (intrauterine device, birth control).  · Surgery:  · D and C (scrape and remove tissue from inside the uterus).  · Laparoscopy (examine inside the abdomen with a lighted tube).  · Uterine ablation (destroy lining of the uterus with electrical current, laser, heat, or freezing).  · Hysteroscopy (examine cervix and uterus with a lighted tube).  · Hysterectomy (remove the uterus).  HOME CARE INSTRUCTIONS   · If medicines were prescribed, take exactly as directed. Do not change or switch medicines without consulting your caregiver.  · Long term heavy bleeding may result in iron deficiency. Your caregiver may have prescribed iron pills. They help replace the iron that your body lost from heavy bleeding. Take exactly as directed.  · Do not take aspirin or medicines that contain aspirin one week before or during your menstrual period. Aspirin may make   the bleeding worse.  · If you need to change your sanitary pad or tampon more than once every 2 hours, stay in bed with your feet elevated and a cold pack on your lower abdomen. Rest as much as possible, until the bleeding stops or slows down.  · Eat well-balanced meals. Eat foods high in iron. Examples are:  · Leafy green vegetables.  · Whole-grain breads and cereals.  · Eggs.  · Meat.  · Liver.  · Do not try to lose weight until the abnormal bleeding has stopped and your blood iron level is  back to normal. Do not lift more than ten pounds or do strenuous activities when you are bleeding.  · For a couple of months, make note on your calendar, marking the start and ending of your period, and the type of bleeding (light, medium, heavy, spotting, clots or missed periods). This is for your caregiver to better evaluate your problem.  SEEK MEDICAL CARE IF:   · You develop nausea (feeling sick to your stomach) and vomiting, dizziness, or diarrhea while you are taking your medicine.  · You are getting lightheaded or weak.  · You have any problems that may be related to the medicine you are taking.  · You develop pain with your DUB.  · You want to remove your IUD.  · You want to stop or change your birth control pills or hormones.  · You have any type of abnormal bleeding mentioned above.  · You are over 16 years old and have not had a menstrual period yet.  · You are 27 years old and you are still having menstrual periods.  · You have any of the symptoms mentioned above.  · You develop a rash.  SEEK IMMEDIATE MEDICAL CARE IF:   · An oral temperature above 102° F (38.9° C) develops.  · You develop chills.  · You are changing your sanitary pad or tampon more than once an hour.  · You develop abdominal pain.  · You pass out or faint.  Document Released: 12/25/1999 Document Revised: 03/21/2011 Document Reviewed: 11/25/2008  [employer]® Patient Information ©2013 [employer].

## 2012-04-25 ENCOUNTER — Inpatient Hospital Stay (HOSPITAL_COMMUNITY)
Admission: AD | Admit: 2012-04-25 | Discharge: 2012-04-25 | Disposition: A | Discharge status: Home / Self Care | Payer: BC Managed Care – PPO | Source: Ambulatory Visit | Attending: Obstetrics & Gynecology | Admitting: Obstetrics & Gynecology

## 2012-04-25 ENCOUNTER — Encounter (HOSPITAL_COMMUNITY): Payer: Self-pay | Admitting: *Deleted

## 2012-04-25 ENCOUNTER — Inpatient Hospital Stay (HOSPITAL_COMMUNITY): Payer: Self-pay

## 2012-04-25 DIAGNOSIS — O99891 Other specified diseases and conditions complicating pregnancy: Secondary | ICD-10-CM | POA: Insufficient documentation

## 2012-04-25 DIAGNOSIS — Z3201 Encounter for pregnancy test, result positive: Secondary | ICD-10-CM

## 2012-04-25 DIAGNOSIS — R109 Unspecified abdominal pain: Secondary | ICD-10-CM | POA: Insufficient documentation

## 2012-04-25 LAB — WET PREP, GENITAL
Clue Cells Wet Prep HPF POC: NONE SEEN
Trich, Wet Prep: NONE SEEN
Yeast Wet Prep HPF POC: NONE SEEN

## 2012-04-25 LAB — URINALYSIS, ROUTINE W REFLEX MICROSCOPIC
Bilirubin Urine: NEGATIVE
Glucose, UA: NEGATIVE mg/dL
Hgb urine dipstick: NEGATIVE
Specific Gravity, Urine: 1.025 (ref 1.005–1.030)
Urobilinogen, UA: 0.2 mg/dL (ref 0.0–1.0)

## 2012-04-25 LAB — POCT PREGNANCY, URINE: Preg Test, Ur: POSITIVE — AB

## 2012-04-25 LAB — CBC
MCHC: 31.5 g/dL (ref 30.0–36.0)
Platelets: 364 10*3/uL (ref 150–400)
RDW: 22 % — ABNORMAL HIGH (ref 11.5–15.5)
WBC: 9.3 10*3/uL (ref 4.0–10.5)

## 2012-04-25 LAB — HCG, QUANTITATIVE, PREGNANCY: hCG, Beta Chain, Quant, S: 138 m[IU]/mL — ABNORMAL HIGH (ref <5)

## 2012-04-25 LAB — ABO/RH: ABO/RH(D): O POS

## 2012-04-25 NOTE — MAU Note (Signed)
Patient states she has had a positive home pregnancy test and wants confirmation. Patient denies bleeding or discharge. Does report intermittent abdominal cramping.

## 2012-04-25 NOTE — MAU Provider Note (Addendum)
History     CSN: [id number]  Arrival date and time: 04/25/12 1806   First Provider Initiated Contact with Patient 04/25/12 1851      Chief Complaint  Patient presents with  . Possible Pregnancy  . Abdominal Cramping   HPI [name] is a 27 y.o. G1P0 at [redacted]w[redacted]d who present to MAU for pregnancy confirmation and abdominal cramping. The patient states that she has been having the lower abdominal cramping off and on since Saturday. The most recent episode was while she was in MAU today. She denies vaginal bleeding, discharge or other abdominal pain. She denies N/V or fever.    OB History   Grav Para Term Preterm Abortions TAB SAB Ect Mult Living   1               Past Medical History  Diagnosis Date  . No pertinent past medical history   . Medical history non-contributory     Past Surgical History  Procedure Laterality Date  . No past surgeries      Family History  Problem Relation Age of Onset  . Alcohol abuse Neg Hx   . Arthritis Neg Hx   . Asthma Neg Hx   . Birth defects Neg Hx   . Cancer Neg Hx   . COPD Neg Hx   . Depression Neg Hx   . Diabetes Neg Hx   . Drug abuse Neg Hx   . Early death Neg Hx   . Hearing loss Neg Hx   . Heart disease Neg Hx   . Hyperlipidemia Neg Hx   . Hypertension Neg Hx   . Kidney disease Neg Hx   . Learning disabilities Neg Hx   . Mental illness Neg Hx   . Mental retardation Neg Hx   . Miscarriages / Stillbirths Neg Hx   . Stroke Neg Hx   . Vision loss Neg Hx     History  Substance Use Topics  . Smoking status: Never Smoker   . Smokeless tobacco: Not on file  . Alcohol Use: No    Allergies: No Known Allergies  No prescriptions prior to admission    Review of Systems  Constitutional: Negative for fever and malaise/fatigue.  Gastrointestinal: Positive for abdominal pain. Negative for nausea, vomiting, diarrhea and constipation.  Genitourinary:       Neg - vaginal bleeding, discharge  Neurological: Negative for  dizziness.   Physical Exam   Blood pressure 123/70, pulse 94, temperature 97.9 F (36.6 C), temperature source Oral, resp. rate 16, height 5' 7.5" (1.715 m), weight 153 lb 12.8 oz (69.763 kg), last menstrual period 04/02/2012, SpO2 100.00%.  Physical Exam  Constitutional: She is oriented to person, place, and time. She appears well-developed and well-nourished. No distress.  HENT:  Head: Normocephalic and atraumatic.  Cardiovascular: Normal rate.   Respiratory: Effort normal.  GI: Soft. Bowel sounds are normal. She exhibits no distension and no mass. There is no tenderness. There is no rebound and no guarding.  Genitourinary: Vagina normal.  Neurological: She is alert and oriented to person, place, and time.  Skin: Skin is warm and dry. No erythema.  Psychiatric: She has a normal mood and affect.   Results for orders placed during the hospital encounter of 04/25/12 (from the past 24 hour(s))  URINALYSIS, ROUTINE W REFLEX MICROSCOPIC     Status: Abnormal   Collection Time    04/25/12  6:25 PM      Result Value Range   Color,  Urine YELLOW  YELLOW   APPearance CLEAR  CLEAR   Specific Gravity, Urine 1.025  1.005 - 1.030   pH 6.0  5.0 - 8.0   Glucose, UA NEGATIVE  NEGATIVE mg/dL   Hgb urine dipstick NEGATIVE  NEGATIVE   Bilirubin Urine NEGATIVE  NEGATIVE   Ketones, ur 15 (*) NEGATIVE mg/dL   Protein, ur NEGATIVE  NEGATIVE mg/dL   Urobilinogen, UA 0.2  0.0 - 1.0 mg/dL   Nitrite NEGATIVE  NEGATIVE   Leukocytes, UA NEGATIVE  NEGATIVE  POCT PREGNANCY, URINE     Status: Abnormal   Collection Time    04/25/12  6:29 PM      Result Value Range   Preg Test, Ur POSITIVE (*) NEGATIVE  WET PREP, GENITAL     Status: Abnormal   Collection Time    04/25/12  7:02 PM      Result Value Range   Yeast Wet Prep HPF POC NONE SEEN  NONE SEEN   Trich, Wet Prep NONE SEEN  NONE SEEN   Clue Cells Wet Prep HPF POC NONE SEEN  NONE SEEN   WBC, Wet Prep HPF POC FEW (*) NONE SEEN  CBC     Status:  Abnormal   Collection Time    04/25/12  7:05 PM      Result Value Range   WBC 9.3  4.0 - 10.5 K/uL   RBC 4.89  3.87 - 5.11 MIL/uL   Hemoglobin 10.3 (*) 12.0 - 15.0 g/dL   HCT 08.6 (*) 57.8 - 46.9 %   MCV 66.9 (*) 78.0 - 100.0 fL   MCH 21.1 (*) 26.0 - 34.0 pg   MCHC 31.5  30.0 - 36.0 g/dL   RDW 62.9 (*) 52.8 - 41.3 %   Platelets 364  150 - 400 K/uL  ABO/RH     Status: None   Collection Time    04/25/12  7:05 PM      Result Value Range   ABO/RH(D) O POS      MAU Course  Procedures None  MDM Wet prep, UA, UPT, CBC, ABO/Rh, quant hCG and Korea today Ultrasound results discussed with patient.    Assessment and Plan  1925 - Patient awaiting Korea. Labs pending. Care turned over to Jeani Sow, NP  A:  Cramping in early pregnancy      U/S--too early to see IUGS         P:  Repeat BHCG in 48 hrs     Return for increase pain or heavy vaginal bleeding before that time     Confirmation letter and list of providers given to the patient.  Verita Schneiders Gayathri Futrell, NP  04/25/2012, 8:35 PM

## 2012-04-25 NOTE — MAU Note (Signed)
+  upt in MAU and at home today; needs a confirmation letter only; denies any pain or bleeding;

## 2012-04-27 ENCOUNTER — Inpatient Hospital Stay (HOSPITAL_COMMUNITY)
Admission: AD | Admit: 2012-04-27 | Discharge: 2012-04-27 | Disposition: A | Discharge status: Home / Self Care | Payer: BC Managed Care – PPO | Source: Ambulatory Visit | Attending: Family Medicine | Admitting: Family Medicine

## 2012-04-27 DIAGNOSIS — O26891 Other specified pregnancy related conditions, first trimester: Secondary | ICD-10-CM

## 2012-04-27 DIAGNOSIS — O99891 Other specified diseases and conditions complicating pregnancy: Secondary | ICD-10-CM | POA: Insufficient documentation

## 2012-04-27 DIAGNOSIS — O9989 Other specified diseases and conditions complicating pregnancy, childbirth and the puerperium: Secondary | ICD-10-CM

## 2012-04-27 DIAGNOSIS — N949 Unspecified condition associated with female genital organs and menstrual cycle: Secondary | ICD-10-CM | POA: Insufficient documentation

## 2012-04-27 LAB — HCG, QUANTITATIVE, PREGNANCY: hCG, Beta Chain, Quant, S: 276 m[IU]/mL — ABNORMAL HIGH (ref <5)

## 2012-04-27 NOTE — MAU Note (Signed)
Here for repeat blood work. No bleeding or d/c. Having some abdominal cramps

## 2012-04-27 NOTE — MAU Note (Signed)
Thressa Sheller CNM in Triage discussing BHCG results and f/u plan. Written and verbal d/c instructions given by Shirley Friar and pt d/c from Triage.

## 2012-04-27 NOTE — MAU Provider Note (Signed)
  History     CSN: [id number]  Arrival date and time: 04/27/12 1923   None     Chief Complaint  Patient presents with  . Follow-up   HPI  Jordan Holt is a 27 y.o. G1P0 at [redacted]w[redacted]d who is here today repeat HCG. She is still having some cramps that "come and go". She rates the pain associated with the cramps at 6-7/10  Past Medical History  Diagnosis Date  . No pertinent past medical history   . Medical history non-contributory     Past Surgical History  Procedure Laterality Date  . No past surgeries      Family History  Problem Relation Age of Onset  . Alcohol abuse Neg Hx   . Arthritis Neg Hx   . Asthma Neg Hx   . Birth defects Neg Hx   . Cancer Neg Hx   . COPD Neg Hx   . Depression Neg Hx   . Diabetes Neg Hx   . Drug abuse Neg Hx   . Early death Neg Hx   . Hearing loss Neg Hx   . Heart disease Neg Hx   . Hyperlipidemia Neg Hx   . Hypertension Neg Hx   . Kidney disease Neg Hx   . Learning disabilities Neg Hx   . Mental illness Neg Hx   . Mental retardation Neg Hx   . Miscarriages / Stillbirths Neg Hx   . Stroke Neg Hx   . Vision loss Neg Hx     History  Substance Use Topics  . Smoking status: Never Smoker   . Smokeless tobacco: Not on file  . Alcohol Use: No    Allergies: No Known Allergies  No prescriptions prior to admission    Review of Systems  Constitutional: Negative for fever.  Gastrointestinal: Positive for abdominal pain. Negative for nausea, vomiting, diarrhea and constipation.  Genitourinary: Negative for dysuria, urgency and frequency.  Neurological: Negative for dizziness.   Physical Exam   Blood pressure 125/75, pulse 99, temperature 98.2 F (36.8 C), resp. rate 20, height 5\' 7"  (1.702 m), weight 69.128 kg (152 lb 6.4 oz), last menstrual period 04/02/2012, SpO2 100.00%.  Physical Exam  Nursing note and vitals reviewed. Constitutional: She is oriented to person, place, and time. She appears well-developed and well-nourished. No  distress.  Cardiovascular: Normal rate.   Respiratory: Effort normal.  GI: Soft.  Neurological: She is alert and oriented to person, place, and time.  Skin: Skin is warm and dry.  Psychiatric: She has a normal mood and affect.    MAU Course  Procedures  Results for orders placed during the hospital encounter of 04/27/12 (from the past 24 hour(s))  HCG, QUANTITATIVE, PREGNANCY     Status: Abnormal   Collection Time    04/27/12  7:31 PM      Result Value Range   hCG, Beta Chain, Quant, S 276 (*) <5 mIU/mL   2100: Spoke with Dr. Shawnie Pons: repeat HCG in 48 hour, consider USE in 1 week if HCG rises appropriately.   Assessment and Plan   1. Pelvic pain complicating pregnancy, antepartum, first trimester    Ectopic precautions reviewed Repeat HCG in 48 hours Consider ultrasound in a week is HCG rises approprietly.   Tawnya Crook 04/27/2012, 8:57 PM

## 2012-04-28 NOTE — MAU Provider Note (Signed)
Chart reviewed and agree with management and plan.  

## 2012-04-29 ENCOUNTER — Inpatient Hospital Stay (HOSPITAL_COMMUNITY)
Admission: AD | Admit: 2012-04-29 | Discharge: 2012-04-29 | Disposition: A | Discharge status: Home / Self Care | Payer: BC Managed Care – PPO | Source: Ambulatory Visit | Attending: Family Medicine | Admitting: Family Medicine

## 2012-04-29 DIAGNOSIS — O26899 Other specified pregnancy related conditions, unspecified trimester: Secondary | ICD-10-CM

## 2012-04-29 DIAGNOSIS — R109 Unspecified abdominal pain: Secondary | ICD-10-CM

## 2012-04-29 DIAGNOSIS — O99891 Other specified diseases and conditions complicating pregnancy: Secondary | ICD-10-CM | POA: Insufficient documentation

## 2012-04-29 NOTE — MAU Provider Note (Signed)
History   Chief Complaint:  Follow-up   Jordan Holt is  27 y.o. G1P0 Patient's last menstrual period was 04/02/2012.Marland Kitchen Patient is here for follow up of quantitative HCG and ongoing surveillance of pregnancy status.   She is [redacted]w[redacted]d weeks gestation  by LMP.    Since her last visit, the patient is without new complaint.   The patient reports bleeding as  none now.  Mild cramping  General ROS:  negative  Her previous Quantitative HCG values are:    Physical Exam   Blood pressure 124/70, pulse 95, temperature 98.1 F (36.7 C), temperature source Oral, resp. rate 18, height 5\' 7"  (1.702 m), weight 68.947 kg (152 lb), last menstrual period 04/02/2012, SpO2 100.00%.  Focused Gynecological Exam: examination not indicated  Labs: Results for orders placed during the hospital encounter of 04/29/12 (from the past 24 hour(s))  HCG, QUANTITATIVE, PREGNANCY   Collection Time    04/29/12  7:44 PM      Result Value Range   hCG, Beta Chain, Quant, S 565 (*) <5 mIU/mL    Ultrasound Studies:   NA  Assessment: [redacted]w[redacted]d weeks gestation with appropriately rising quants. IUP not confirmed.   Plan: D/C home Follow-up Information   Follow up with THE [facility] [place] ULTRASOUND On 05/14/2012. (Will call to schedule ultrasound)    Contact information:   [address] [place] [address] [phone]      Follow up with THE [facility] [place] MATERNITY ADMISSIONS. (If symptoms worsen)    Contact information:   [address] [place] [address] [phone]     Dorathy Kinsman 04/29/2012, 8:36 PM

## 2012-04-29 NOTE — MAU Provider Note (Signed)
Chart reviewed and agree with management and plan.  

## 2012-04-29 NOTE — MAU Note (Signed)
Pt presents for follow up BHCG. Denies bleeding, some occasional mild cramping.

## 2012-05-14 ENCOUNTER — Ambulatory Visit (HOSPITAL_COMMUNITY)
Admission: RE | Admit: 2012-05-14 | Discharge: 2012-05-14 | Disposition: A | Discharge status: Home / Self Care | Payer: Self-pay | Source: Ambulatory Visit | Attending: Advanced Practice Midwife | Admitting: Advanced Practice Midwife

## 2012-05-14 ENCOUNTER — Inpatient Hospital Stay (HOSPITAL_COMMUNITY)
Admission: AD | Admit: 2012-05-14 | Discharge: 2012-05-14 | Disposition: A | Discharge status: Home / Self Care | Payer: BC Managed Care – PPO | Source: Ambulatory Visit | Attending: Obstetrics & Gynecology | Admitting: Obstetrics & Gynecology

## 2012-05-14 ENCOUNTER — Encounter (HOSPITAL_COMMUNITY): Payer: Self-pay | Admitting: Advanced Practice Midwife

## 2012-05-14 DIAGNOSIS — O209 Hemorrhage in early pregnancy, unspecified: Secondary | ICD-10-CM | POA: Insufficient documentation

## 2012-05-14 DIAGNOSIS — R109 Unspecified abdominal pain: Secondary | ICD-10-CM

## 2012-05-14 DIAGNOSIS — Z1389 Encounter for screening for other disorder: Secondary | ICD-10-CM

## 2012-05-14 DIAGNOSIS — Z3689 Encounter for other specified antenatal screening: Secondary | ICD-10-CM | POA: Insufficient documentation

## 2012-05-14 DIAGNOSIS — Z3490 Encounter for supervision of normal pregnancy, unspecified, unspecified trimester: Secondary | ICD-10-CM

## 2012-05-14 NOTE — MAU Note (Signed)
Patient to MAU for follow up after ultrasound. Denies pain or bleeding. Does have a cold and wants to know what she can take.

## 2012-05-14 NOTE — MAU Provider Note (Signed)
27 y.o. G1P0 at [redacted]w[redacted]d here for f/u ultrasound for viability. Denies pain or bleeding today. Does have some congestion and wants to know safe meds in pregnancy.   BP 114/67  Pulse 80  Temp(Src) 98.2 F (36.8 C) (Oral)  Resp 16  SpO2 100%  LMP 04/02/2012  Gen: well  U/S: 6 week IUP, + FHR, moderate subchorionic hemorrhage  A/P: 27 y.o. G1P0 at [redacted]w[redacted]d with IUP F/U for prenatal care ASAP - pregnancy verification and list of providers given List of safe meds in pregnancy given

## 2012-05-14 NOTE — MAU Provider Note (Signed)
Chart reviewed and agree with management and plan.  

## 2012-06-09 ENCOUNTER — Encounter (HOSPITAL_COMMUNITY): Payer: Self-pay

## 2012-06-09 ENCOUNTER — Inpatient Hospital Stay (HOSPITAL_COMMUNITY)
Admission: AD | Admit: 2012-06-09 | Discharge: 2012-06-09 | Disposition: A | Discharge status: Home / Self Care | Payer: BC Managed Care – PPO | Source: Ambulatory Visit | Attending: Obstetrics & Gynecology | Admitting: Obstetrics & Gynecology

## 2012-06-09 DIAGNOSIS — O210 Mild hyperemesis gravidarum: Secondary | ICD-10-CM | POA: Insufficient documentation

## 2012-06-09 DIAGNOSIS — O219 Vomiting of pregnancy, unspecified: Secondary | ICD-10-CM

## 2012-06-09 DIAGNOSIS — O9989 Other specified diseases and conditions complicating pregnancy, childbirth and the puerperium: Secondary | ICD-10-CM | POA: Insufficient documentation

## 2012-06-09 DIAGNOSIS — K117 Disturbances of salivary secretion: Secondary | ICD-10-CM

## 2012-06-09 LAB — URINALYSIS, ROUTINE W REFLEX MICROSCOPIC
Bilirubin Urine: NEGATIVE
Glucose, UA: NEGATIVE mg/dL
Hgb urine dipstick: NEGATIVE
Protein, ur: NEGATIVE mg/dL
Urobilinogen, UA: 1 mg/dL (ref 0.0–1.0)

## 2012-06-09 MED ORDER — GLYCOPYRROLATE 1 MG PO TABS: 1.0000 mg | ORAL_TABLET | Freq: Three times a day (TID) | ORAL | Status: DC

## 2012-06-09 NOTE — MAU Note (Signed)
Patient states she is mainly spitting able to keep some food and fluids down currently not taking anything for nausea.

## 2012-06-09 NOTE — MAU Note (Signed)
Pt reports nausea and vomitng for several weeks and feeling weak. Not stared prenatal care yet [facility]

## 2012-06-09 NOTE — MAU Provider Note (Signed)
Attestation of Attending Supervision of Advanced Practitioner (CNM/NP): Evaluation and management procedures were performed by the Advanced Practitioner under my supervision and collaboration.  I have reviewed the Advanced Practitioner's note and chart, and I agree with the management and plan.  HARRAWAY-SMITH, Raza Bayless 7:14 PM     

## 2012-06-09 NOTE — MAU Provider Note (Signed)
History     CSN: [id number]  Arrival date and time: 06/09/12 1755   First Provider Initiated Contact with Patient 06/09/12 1833      Chief Complaint  Patient presents with  . Emesis During Pregnancy   HPI This is a 27 y.o. female at [redacted]w[redacted]d who presents with c/o intermittent nausea/vomiting (maybe once a day) and mostly c/o excessive spitting. Wants to make sure baby is all right, since her appt is not until July.   RN Notes: Pt reports nausea and vomitng for several weeks and feeling weak. Not stared prenatal care yet [facility]     Patient states she is mainly spitting able to keep some food and fluids down currently not taking anything for nausea.       OB History   Grav Para Term Preterm Abortions TAB SAB Ect Mult Living   1               Past Medical History  Diagnosis Date  . No pertinent past medical history   . Medical history non-contributory     Past Surgical History  Procedure Laterality Date  . No past surgeries      Family History  Problem Relation Age of Onset  . Alcohol abuse Neg Hx   . Arthritis Neg Hx   . Asthma Neg Hx   . Birth defects Neg Hx   . Cancer Neg Hx   . COPD Neg Hx   . Depression Neg Hx   . Diabetes Neg Hx   . Drug abuse Neg Hx   . Early death Neg Hx   . Hearing loss Neg Hx   . Heart disease Neg Hx   . Hyperlipidemia Neg Hx   . Hypertension Neg Hx   . Kidney disease Neg Hx   . Learning disabilities Neg Hx   . Mental illness Neg Hx   . Mental retardation Neg Hx   . Miscarriages / Stillbirths Neg Hx   . Stroke Neg Hx   . Vision loss Neg Hx     History  Substance Use Topics  . Smoking status: Never Smoker   . Smokeless tobacco: Not on file  . Alcohol Use: No    Allergies: No Known Allergies  Prescriptions prior to admission  Medication Sig Dispense Refill  . Prenatal Vit-Fe Fumarate-FA (PRENATAL MULTIVITAMIN) TABS Take 1 tablet by mouth daily at 12 noon.        Review of  Systems  Constitutional: Negative for fever, chills and malaise/fatigue.  HENT:       Spitting  Gastrointestinal: Positive for nausea and vomiting. Negative for abdominal pain, diarrhea and constipation.  Genitourinary: Negative for dysuria.  Neurological: Negative for dizziness, weakness and headaches.   Physical Exam   Blood pressure 112/63, pulse 91, temperature 98.3 F (36.8 C), temperature source Oral, resp. rate 18, height 5\' 7"  (1.702 m), weight 66.497 kg (146 lb 9.6 oz), last menstrual period 04/02/2012.  Physical Exam  Constitutional: She is oriented to person, place, and time. She appears well-developed and well-nourished. No distress.  Cardiovascular: Normal rate.   Respiratory: Effort normal.  GI: Soft. She exhibits no distension. There is no tenderness. There is no rebound and no guarding.  Fetal heart rate heard just above SP bone at 155   Genitourinary: No vaginal discharge found.  Musculoskeletal: Normal range of motion.  Neurological: She is alert and oriented to person, place, and time.  Skin: Skin is warm  and dry.  Psychiatric: She has a normal mood and affect.   Results for orders placed during the hospital encounter of 06/09/12 (from the past 24 hour(s))  URINALYSIS, ROUTINE W REFLEX MICROSCOPIC     Status: None   Collection Time    06/09/12  6:10 PM      Result Value Range   Color, Urine YELLOW  YELLOW   APPearance CLEAR  CLEAR   Specific Gravity, Urine 1.020  1.005 - 1.030   pH 7.0  5.0 - 8.0   Glucose, UA NEGATIVE  NEGATIVE mg/dL   Hgb urine dipstick NEGATIVE  NEGATIVE   Bilirubin Urine NEGATIVE  NEGATIVE   Ketones, ur NEGATIVE  NEGATIVE mg/dL   Protein, ur NEGATIVE  NEGATIVE mg/dL   Urobilinogen, UA 1.0  0.0 - 1.0 mg/dL   Nitrite NEGATIVE  NEGATIVE   Leukocytes, UA NEGATIVE  NEGATIVE     MAU Course  Procedures  Assessment and Plan  A:  SIUP at [redacted]w[redacted]d       Ptyalism      Occasional nausea and vomiting  P:  Reassured by hearing Fetal heart  beat      Rx Robinul for spitting      Followup as scheduled in clinic for new ob  [facility] 06/09/2012, 6:46 PM

## 2012-07-13 ENCOUNTER — Inpatient Hospital Stay (HOSPITAL_COMMUNITY)
Admission: AD | Admit: 2012-07-13 | Discharge: 2012-07-13 | Disposition: A | Discharge status: Home / Self Care | Payer: BC Managed Care – PPO | Source: Ambulatory Visit | Attending: Obstetrics & Gynecology | Admitting: Obstetrics & Gynecology

## 2012-07-13 ENCOUNTER — Encounter (HOSPITAL_COMMUNITY): Payer: Self-pay | Admitting: *Deleted

## 2012-07-13 DIAGNOSIS — R109 Unspecified abdominal pain: Secondary | ICD-10-CM | POA: Insufficient documentation

## 2012-07-13 DIAGNOSIS — O99891 Other specified diseases and conditions complicating pregnancy: Secondary | ICD-10-CM | POA: Insufficient documentation

## 2012-07-13 DIAGNOSIS — O9989 Other specified diseases and conditions complicating pregnancy, childbirth and the puerperium: Secondary | ICD-10-CM

## 2012-07-13 DIAGNOSIS — N949 Unspecified condition associated with female genital organs and menstrual cycle: Secondary | ICD-10-CM

## 2012-07-13 DIAGNOSIS — O26899 Other specified pregnancy related conditions, unspecified trimester: Secondary | ICD-10-CM

## 2012-07-13 DIAGNOSIS — Z3490 Encounter for supervision of normal pregnancy, unspecified, unspecified trimester: Secondary | ICD-10-CM

## 2012-07-13 LAB — CBC WITH DIFFERENTIAL/PLATELET
Basophils Relative: 0 % (ref 0–1)
Eosinophils Absolute: 0.4 10*3/uL (ref 0.0–0.7)
Eosinophils Relative: 5 % (ref 0–5)
HCT: 26.8 % — ABNORMAL LOW (ref 36.0–46.0)
Hemoglobin: 8.9 g/dL — ABNORMAL LOW (ref 12.0–15.0)
Lymphocytes Relative: 20 % (ref 12–46)
Monocytes Relative: 6 % (ref 3–12)
Neutrophils Relative %: 69 % (ref 43–77)
RBC: 4.16 MIL/uL (ref 3.87–5.11)
WBC: 7.5 10*3/uL (ref 4.0–10.5)

## 2012-07-13 LAB — URINALYSIS, ROUTINE W REFLEX MICROSCOPIC
Glucose, UA: NEGATIVE mg/dL
Hgb urine dipstick: NEGATIVE
Ketones, ur: 15 mg/dL — AB
Leukocytes, UA: NEGATIVE
Protein, ur: NEGATIVE mg/dL
pH: 6 (ref 5.0–8.0)

## 2012-07-13 MED ORDER — ACETAMINOPHEN 325 MG PO TABS
650.0000 mg | ORAL_TABLET | Freq: Once | ORAL | Status: AC
  Administered 2012-07-13: 650 mg via ORAL
  Filled 2012-07-13: qty 2

## 2012-07-13 NOTE — MAU Note (Signed)
Pt reports abdominal pain for 2 hours that feels like cramping. Denies vaginal bleeding, denies vaginal discharge, denies painful urination, denies diarrhea or constipation.

## 2012-07-13 NOTE — MAU Provider Note (Signed)
History     CSN: [id number]  Arrival date and time: 07/13/12 1823   None     Chief Complaint  Patient presents with  . Abdominal Pain   HPI  Jordan Holt is 27 y.o. G1P0 [redacted]w[redacted]d weeks presenting with abdominal pain that began 2 hours ago.  She points to the lower sides of her abdomen. Last ate at approx 1 1/2 hrs before discomfort began.  She ate pizza and pretzels.  She denies indigestion and heartburn, nausea and vomiting.   Usually doesn't have problems eating these foods. Last BM was yesterday , normal for her.  Denies vaginal bleeding, abnormal discharge.   She was last seen in MAU 5/31 for nausea, vomiting, and spitting in pregnancy.  She has an appt with [facility] 7/21 to begin her prenatal care.   Past Medical History  Diagnosis Date  . No pertinent past medical history   . Medical history non-contributory     Past Surgical History  Procedure Laterality Date  . No past surgeries      Family History  Problem Relation Age of Onset  . Alcohol abuse Neg Hx   . Arthritis Neg Hx   . Asthma Neg Hx   . Birth defects Neg Hx   . Cancer Neg Hx   . COPD Neg Hx   . Depression Neg Hx   . Diabetes Neg Hx   . Drug abuse Neg Hx   . Early death Neg Hx   . Hearing loss Neg Hx   . Heart disease Neg Hx   . Hyperlipidemia Neg Hx   . Hypertension Neg Hx   . Kidney disease Neg Hx   . Learning disabilities Neg Hx   . Mental illness Neg Hx   . Mental retardation Neg Hx   . Miscarriages / Stillbirths Neg Hx   . Stroke Neg Hx   . Vision loss Neg Hx     History  Substance Use Topics  . Smoking status: Never Smoker   . Smokeless tobacco: Not on file  . Alcohol Use: No    Allergies: No Known Allergies  No prescriptions prior to admission    Review of Systems  Constitutional: Negative for fever and chills.  Gastrointestinal: Positive for abdominal pain (bil lower sides). Negative for nausea, vomiting, diarrhea and constipation.  Genitourinary: Negative.   Neurological:  Negative for headaches.   Physical Exam   Blood pressure 111/61, pulse 90, temperature 97.9 F (36.6 C), temperature source Oral, resp. rate 16, height 5\' 7"  (1.702 m), weight 145 lb (65.772 kg), last menstrual period 04/02/2012.  Physical Exam  Constitutional: She is oriented to person, place, and time. She appears well-developed and well-nourished. No distress.  HENT:  Head: Normocephalic.  Neck: Normal range of motion.  Cardiovascular: Normal rate.   Respiratory: Effort normal.  GI: Soft. She exhibits no distension and no mass. There is no tenderness. There is no rebound and no guarding.  Genitourinary: There is no rash, tenderness or lesion on the right labia. There is no rash, tenderness or lesion on the left labia. Uterus is enlarged (14 week size). Uterus is not tender. No erythema, tenderness or bleeding around the vagina. No vaginal discharge found.  Neurological: She is alert and oriented to person, place, and time.  Skin: Skin is warm and dry.  Psychiatric: She has a normal mood and affect. Her behavior is normal.   Results for orders placed during the hospital encounter of 07/13/12 (from the past 24 hour(s))  URINALYSIS, ROUTINE W REFLEX MICROSCOPIC     Status: Abnormal   Collection Time    07/13/12  5:30 PM      Result Value Range   Color, Urine YELLOW  YELLOW   APPearance CLEAR  CLEAR   Specific Gravity, Urine >1.030 (*) 1.005 - 1.030   pH 6.0  5.0 - 8.0   Glucose, UA NEGATIVE  NEGATIVE mg/dL   Hgb urine dipstick NEGATIVE  NEGATIVE   Bilirubin Urine NEGATIVE  NEGATIVE   Ketones, ur 15 (*) NEGATIVE mg/dL   Protein, ur NEGATIVE  NEGATIVE mg/dL   Urobilinogen, UA 0.2  0.0 - 1.0 mg/dL   Nitrite NEGATIVE  NEGATIVE   Leukocytes, UA NEGATIVE  NEGATIVE  CBC WITH DIFFERENTIAL     Status: Abnormal   Collection Time    07/13/12  6:54 PM      Result Value Range   WBC 7.5  4.0 - 10.5 K/uL   RBC 4.16  3.87 - 5.11 MIL/uL   Hemoglobin 8.9 (*) 12.0 - 15.0 g/dL   HCT 19.1 (*)  47.8 - 46.0 %   MCV 64.4 (*) 78.0 - 100.0 fL   MCH 21.4 (*) 26.0 - 34.0 pg   MCHC 33.2  30.0 - 36.0 g/dL   RDW 29.5 (*) 62.1 - 30.8 %   Platelets 364  150 - 400 K/uL   Neutrophils Relative % 69  43 - 77 %   Lymphocytes Relative 20  12 - 46 %   Monocytes Relative 6  3 - 12 %   Eosinophils Relative 5  0 - 5 %   Basophils Relative 0  0 - 1 %   Neutro Abs 5.1  1.7 - 7.7 K/uL   Lymphs Abs 1.5  0.7 - 4.0 K/uL   Monocytes Absolute 0.5  0.1 - 1.0 K/uL   Eosinophils Absolute 0.4  0.0 - 0.7 K/uL   Basophils Absolute 0.0  0.0 - 0.1 K/uL   RBC Morphology ELLIPTOCYTES     MAU Course  Procedures  MDM Tylenol 650mg  po given in MAU  Assessment and Plan  A:  Abdominal pain at [redacted]w[redacted]d consistent with round ligament  Pain      Viable pregnancy by [facility] dopplered  P:  Discussed avoiding quick movements, importance of staying well hydrated, and use of tylenol for discomfort      Keep appt with [facility] to begin prenatal care.  KEY,EVE M 07/13/2012, 7:48 PM

## 2012-07-15 NOTE — MAU Provider Note (Signed)
Attestation of Attending Supervision of Advanced Practitioner (CNM/NP): Evaluation and management procedures were performed by the Advanced Practitioner under my supervision and collaboration. I have reviewed the Advanced Practitioner's note and chart, and I agree with the management and plan.  Regina Ganci H. 11:27 AM   

## 2012-07-30 ENCOUNTER — Ambulatory Visit (INDEPENDENT_AMBULATORY_CARE_PROVIDER_SITE_OTHER): Payer: BC Managed Care – PPO | Admitting: Obstetrics & Gynecology

## 2012-07-30 ENCOUNTER — Encounter: Payer: Self-pay | Admitting: Obstetrics & Gynecology

## 2012-07-30 VITALS — BP 105/68 | Temp 98.1°F | Wt 146.0 lb

## 2012-07-30 DIAGNOSIS — Z3201 Encounter for pregnancy test, result positive: Secondary | ICD-10-CM

## 2012-07-30 DIAGNOSIS — O30003 Twin pregnancy, unspecified number of placenta and unspecified number of amniotic sacs, third trimester: Secondary | ICD-10-CM | POA: Insufficient documentation

## 2012-07-30 DIAGNOSIS — Z3402 Encounter for supervision of normal first pregnancy, second trimester: Secondary | ICD-10-CM

## 2012-07-30 LAB — POCT URINALYSIS DIPSTICK
Bilirubin, UA: NEGATIVE
Glucose, UA: NEGATIVE
Ketones, UA: NEGATIVE
Protein, UA: NEGATIVE

## 2012-07-30 NOTE — Progress Notes (Signed)
Pt states she has some pain and pressure when sitting down.   Subjective:    Jordan Holt is being seen today for her first obstetrical visit.  This is a planned pregnancy. She is at [redacted]w[redacted]d gestation. Her obstetrical history is significant for none. Relationship with FOB: spouse, living together. Patient does intend to breast feed. Pregnancy history fully reviewed.  Menstrual History: OB History   Grav Para Term Preterm Abortions TAB SAB Ect Mult Living   1               Menarche age: 48   Patient's last menstrual period was 04/02/2012.  Last Pap: 2013 Results were normal  The following portions of the patient's history were reviewed and updated as appropriate: allergies, current medications, past family history, past medical history, past social history, past surgical history and problem list.  Review of Systems Pertinent items are noted in HPI.    Objective:   General Appearance:    Alert, cooperative, no distress, appears stated age  Head:    Normocephalic, without obvious abnormality, atraumatic  Eyes:    PERRL, conjunctiva/corneas clear, EOM's intact, fundi    benign, both eyes  Ears:    Normal TM's and external ear canals, both ears  Nose:   Nares normal, septum midline, mucosa normal, no drainage    or sinus tenderness  Throat:   Lips, mucosa, and tongue normal; teeth and gums normal  Neck:   Supple, symmetrical, trachea midline, no adenopathy;    thyroid:  no enlargement/tenderness/nodules; no carotid   bruit or JVD  Back:     Symmetric, no curvature, ROM normal, no CVA tenderness  Lungs:     Clear to auscultation bilaterally, respirations unlabored  Chest Wall:    No tenderness or deformity   Heart:    Regular rate and rhythm, S1 and S2 normal, no murmur, rub   or gallop  Breast Exam:    No tenderness, masses, or nipple abnormality  Abdomen:     Soft, non-tender, bowel sounds active all four quadrants,    no masses, no organomegaly  Genitalia:    Normal female without  lesion, discharge or tenderness  Extremities:   Extremities normal, atraumatic, no cyanosis or edema  Pulses:   2+ and symmetric all extremities  Skin:   Skin color, texture, turgor normal, no rashes or lesions  Lymph nodes:   Cervical, supraclavicular, and axillary nodes normal  Neurologic:   CNII-XII intact, normal strength, sensation and reflexes    throughout    Assessment:    Pregnancy at [redacted]w[redacted]d weeks    Plan:    Initial labs drawn. Prenatal vitamins.  Counseling provided regarding continued use of seat belts, cessation of alcohol consumption, smoking or use of illicit drugs; infection precautions i.e., influenza/TDAP immunizations, toxoplasmosis,CMV, parvovirus, listeria and varicella; workplace safety, exercise during pregnancy; routine dental care, safe medications, sexual activity, hot tubs, saunas, pools, travel, caffeine use, fish and methlymercury, potential toxins, hair treatments, varicose veins Weight gain recommendations reviewed: underweight/BMI< 18.5--> gain 28 - 40 lbs; normal weight/BMI 18.5 - 24.9--> gain 25 - 35 lbs; overweight/BMI 25 - 29.9--> gain 15 - 25 lbs; obese/BMI >30->gain  11 - 20 lbs Problem list reviewed and updated. Quad screen discussed: declined. Role of ultrasound in pregnancy discussed; fetal survey: ordered. Amniocentesis discussed: not indicated. Follow up in 4 weeks. 50% of 20 min visit spent on counseling and coordination of care.

## 2012-07-31 ENCOUNTER — Encounter: Payer: Self-pay | Admitting: Obstetrics & Gynecology

## 2012-07-31 LAB — OBSTETRIC PANEL
Basophils Absolute: 0 10*3/uL (ref 0.0–0.1)
HCT: 29.1 % — ABNORMAL LOW (ref 36.0–46.0)
Lymphocytes Relative: 24 % (ref 12–46)
Monocytes Absolute: 0.4 10*3/uL (ref 0.1–1.0)
Neutro Abs: 4.6 10*3/uL (ref 1.7–7.7)
Platelets: 438 10*3/uL — ABNORMAL HIGH (ref 150–400)
RBC: 4.31 MIL/uL (ref 3.87–5.11)
RDW: 22.4 % — ABNORMAL HIGH (ref 11.5–15.5)
Rubella: 2.31 Index — ABNORMAL HIGH (ref <0.90)
WBC: 7 10*3/uL (ref 4.0–10.5)

## 2012-07-31 NOTE — Patient Instructions (Signed)
Pregnancy - Second Trimester The second trimester is the period between 13 to 27 weeks of your pregnancy. It is important to follow your doctor's instructions. HOME CARE   Do not smoke.  Do not drink alcohol or use drugs.  Only take medicine as told by your doctor.  Take prenatal vitamins as told. The vitamin should contain 1 milligram of folic acid.  Exercise.  Eat healthy foods. Eat regular, well-balanced meals.  You can have sex (intercourse) if there are no other problems with the pregnancy.  Do not use hot tubs, steam rooms, or saunas.  Wear a seat belt while driving.  Avoid raw meat, uncooked cheese, and litter boxes and soil used by cats.  Visit your dentist. Cleanings are okay. GET HELP RIGHT AWAY IF:   You have a temperature by mouth above 102 F (38.9 C), not controlled by medicine.  Fluid is coming from your vagina.  Blood is coming from your vagina. Light spotting is common, especially after sex (intercourse).  You have a bad smelling fluid (discharge) coming from the vagina. The fluid changes from clear to white.  You still feel sick to your stomach (nauseous).  You throw up (vomit) blood.  You lose or gain more than 2 pounds (0.9 kilograms) of weight in a week, or as suggested by your doctor.  Your face, hands, feet, or legs get puffy (swell).  You get exposed to German measles and have never had them.  You get exposed to fifth disease or chickenpox.  You have belly (abdominal) pain.  You have a bad headache that will not go away.  You have watery poop (diarrhea), pain when you pee (urinate), or have shortness of breath.  You start to have problems seeing (blurry or double vision).  You fall, are in a car accident, or have any kind of trauma.  There is mental or physical violence at home.  You have any concerns or worries during your pregnancy. MAKE SURE YOU:   Understand these instructions.  Will watch your condition.  Will get help  right away if you are not doing well or get worse. Document Released: 03/23/2009 Document Revised: 03/21/2011 Document Reviewed: 03/23/2009 [employer] Patient Information 2014 [employer].  

## 2012-08-01 LAB — CULTURE, OB URINE
Colony Count: NO GROWTH
Organism ID, Bacteria: NO GROWTH

## 2012-08-01 LAB — HEMOGLOBINOPATHY EVALUATION
Hgb F Quant: 0 % (ref 0.0–2.0)
Hgb S Quant: 0 %

## 2012-08-07 ENCOUNTER — Other Ambulatory Visit: Payer: Self-pay | Admitting: *Deleted

## 2012-08-07 ENCOUNTER — Encounter: Payer: Self-pay | Admitting: Obstetrics & Gynecology

## 2012-08-07 DIAGNOSIS — Z3402 Encounter for supervision of normal first pregnancy, second trimester: Secondary | ICD-10-CM

## 2012-08-07 DIAGNOSIS — D509 Iron deficiency anemia, unspecified: Secondary | ICD-10-CM | POA: Insufficient documentation

## 2012-08-07 LAB — OB RESULTS CONSOLE GC/CHLAMYDIA: Gonorrhea: NEGATIVE

## 2012-08-07 NOTE — Progress Notes (Signed)
Quick Note:  Needs PNV w/vitamin D. Also needs Fe supplement ______

## 2012-08-08 ENCOUNTER — Encounter: Payer: Self-pay | Admitting: Obstetrics & Gynecology

## 2012-08-08 ENCOUNTER — Ambulatory Visit (INDEPENDENT_AMBULATORY_CARE_PROVIDER_SITE_OTHER): Payer: BC Managed Care – PPO

## 2012-08-08 DIAGNOSIS — Z3402 Encounter for supervision of normal first pregnancy, second trimester: Secondary | ICD-10-CM

## 2012-08-08 DIAGNOSIS — Z1389 Encounter for screening for other disorder: Secondary | ICD-10-CM

## 2012-08-08 LAB — US OB DETAIL + 14 WK

## 2012-08-22 ENCOUNTER — Encounter: Payer: BC Managed Care – PPO | Admitting: Obstetrics & Gynecology

## 2012-08-23 ENCOUNTER — Ambulatory Visit (INDEPENDENT_AMBULATORY_CARE_PROVIDER_SITE_OTHER): Payer: Medicaid Other | Admitting: Obstetrics & Gynecology

## 2012-08-23 VITALS — BP 109/69 | Temp 98.6°F | Wt 147.0 lb

## 2012-08-23 DIAGNOSIS — Z3400 Encounter for supervision of normal first pregnancy, unspecified trimester: Secondary | ICD-10-CM

## 2012-08-23 DIAGNOSIS — Z3401 Encounter for supervision of normal first pregnancy, first trimester: Secondary | ICD-10-CM

## 2012-08-23 LAB — POCT URINALYSIS DIPSTICK
Leukocytes, UA: NEGATIVE
Nitrite, UA: NEGATIVE
Protein, UA: NEGATIVE
pH, UA: 7

## 2012-08-23 NOTE — Progress Notes (Signed)
P 97 Patient reports she has some stretching discomfort, but not bothersome.

## 2012-08-27 ENCOUNTER — Encounter: Payer: Self-pay | Admitting: Obstetrics & Gynecology

## 2012-08-27 NOTE — Progress Notes (Signed)
Doing well 

## 2012-08-27 NOTE — Patient Instructions (Signed)
Glucose Tolerance Test This is a test to see how your body processes carbohydrates. This test is often done to check patients for diabetes or the possibility of developing it. PREPARATION FOR TEST You should have nothing to eat or drink 12 hours before the test. You will be given a form of sugar (glucose) and then blood samples will be drawn from your vein to determine the level of sugar in your blood. Alternatively, blood may be drawn from your finger for testing. You should not smoke or exercise during the test. NORMAL FINDINGS  Fasting: 70-115 mg/dL  30 minutes: less than 200 mg/dL  1 hour: less than 200 mg/dL  2 hours: less than 140 mg/dL  3 hours: 70-115 mg/dL  4 hours: 70-115 mg/dL Ranges for normal findings may vary among different laboratories and hospitals. You should always check with your doctor after having lab work or other tests done to discuss the meaning of your test results and whether your values are considered within normal limits. MEANING OF TEST Your caregiver will go over the test results with you and discuss the importance and meaning of your results, as well as treatment options and the need for additional tests. OBTAINING THE TEST RESULTS It is your responsibility to obtain your test results. Ask the lab or department performing the test when and how you will get your results. Document Released: 01/20/2004 Document Revised: 03/21/2011 Document Reviewed: 12/08/2007 [employer] Patient Information 2014 [employer].  

## 2012-09-20 ENCOUNTER — Other Ambulatory Visit: Payer: BC Managed Care – PPO

## 2012-09-20 ENCOUNTER — Ambulatory Visit (INDEPENDENT_AMBULATORY_CARE_PROVIDER_SITE_OTHER): Payer: Medicaid Other | Admitting: Advanced Practice Midwife

## 2012-09-20 VITALS — BP 107/72 | Temp 98.5°F | Wt 147.8 lb

## 2012-09-20 DIAGNOSIS — Z3400 Encounter for supervision of normal first pregnancy, unspecified trimester: Secondary | ICD-10-CM

## 2012-09-20 DIAGNOSIS — Z3402 Encounter for supervision of normal first pregnancy, second trimester: Secondary | ICD-10-CM

## 2012-09-20 LAB — CBC
HCT: 27.9 % — ABNORMAL LOW (ref 36.0–46.0)
MCHC: 31.2 g/dL (ref 30.0–36.0)
MCV: 68.7 fL — ABNORMAL LOW (ref 78.0–100.0)
RDW: 19.2 % — ABNORMAL HIGH (ref 11.5–15.5)
WBC: 7.3 10*3/uL (ref 4.0–10.5)

## 2012-09-20 NOTE — Progress Notes (Signed)
Pulse: 89

## 2012-09-20 NOTE — Progress Notes (Signed)
Subjective: Jordan Holt is a 27 y.o. at 24 weeks by LMP  Patient denies vaginal leaking of fluid or bleeding, denies contractions.  Reports positive fetal movment.  Denies concerns today.  Expecting a boy. No family near by, they are in [place]. They moved here for her husband, he is obtaining his PhD in Lobby[employer]. Plans to [facility].  Objective: Filed Vitals:   09/20/12 0920  BP: 107/72  Temp: 98.5 F (36.9 C)   150 FHR 23 Fundal Height Fetal Position unknown  Assessment: Patient Active Problem List   Diagnosis Date Noted  . Iron (Fe) deficiency anemia 08/07/2012  . Supervision of normal first pregnancy 07/30/2012  . DUB (dysfunctional uterine bleeding) 03/09/2012    Plan: Patient to return to clinic in 4 weeks Reviewed warning signs in pregnancy. Patient to call with concerns PRN. Reviewed triage location. Reviewed options for BRF classes. 2 hour GCT today.  Javonna Balli Wilson Singer CNM

## 2012-09-30 ENCOUNTER — Telehealth: Payer: Self-pay | Admitting: *Deleted

## 2012-09-30 MED ORDER — FUSION PLUS PO CAPS: 1.0000 | ORAL_CAPSULE | Freq: Every day | ORAL | Status: DC

## 2012-09-30 NOTE — Telephone Encounter (Signed)
Attempted to contact patient at [phone], no answer.No voice message left. Rx sent to pharmacy. Pt is not aware. Ob sticky reminder note placed for patients appointment on 10/08/2013

## 2012-09-30 NOTE — Telephone Encounter (Signed)
Message copied by Glendell Docker on Sun Sep 30, 2012  2:12 PM ------      Message from: Coral Ceo A      Created: Fri Sep 21, 2012  3:54 PM       Fusion Plus 1 po daily. ------

## 2012-10-04 ENCOUNTER — Ambulatory Visit (INDEPENDENT_AMBULATORY_CARE_PROVIDER_SITE_OTHER): Payer: BC Managed Care – PPO | Admitting: Obstetrics

## 2012-10-04 ENCOUNTER — Encounter: Payer: Self-pay | Admitting: Obstetrics

## 2012-10-04 VITALS — BP 122/79 | Temp 97.9°F | Wt 149.6 lb

## 2012-10-04 DIAGNOSIS — Z3483 Encounter for supervision of other normal pregnancy, third trimester: Secondary | ICD-10-CM

## 2012-10-04 DIAGNOSIS — Z3402 Encounter for supervision of normal first pregnancy, second trimester: Secondary | ICD-10-CM

## 2012-10-04 DIAGNOSIS — Z3400 Encounter for supervision of normal first pregnancy, unspecified trimester: Secondary | ICD-10-CM

## 2012-10-04 LAB — POCT URINALYSIS DIPSTICK
Bilirubin, UA: NEGATIVE
Blood, UA: NEGATIVE
Glucose, UA: NEGATIVE
Leukocytes, UA: NEGATIVE
Nitrite, UA: NEGATIVE

## 2012-10-04 NOTE — Progress Notes (Signed)
Pulse- 80 

## 2012-10-17 ENCOUNTER — Encounter: Payer: BC Managed Care – PPO | Admitting: Obstetrics & Gynecology

## 2012-10-18 ENCOUNTER — Ambulatory Visit (INDEPENDENT_AMBULATORY_CARE_PROVIDER_SITE_OTHER): Payer: Medicaid Other | Admitting: Obstetrics & Gynecology

## 2012-10-18 ENCOUNTER — Encounter: Payer: BC Managed Care – PPO | Admitting: Obstetrics

## 2012-10-18 ENCOUNTER — Encounter: Payer: Self-pay | Admitting: Obstetrics & Gynecology

## 2012-10-18 VITALS — BP 122/80 | Temp 97.8°F | Wt 148.0 lb

## 2012-10-18 DIAGNOSIS — Z3400 Encounter for supervision of normal first pregnancy, unspecified trimester: Secondary | ICD-10-CM

## 2012-10-18 DIAGNOSIS — Z3403 Encounter for supervision of normal first pregnancy, third trimester: Secondary | ICD-10-CM

## 2012-10-18 DIAGNOSIS — Z23 Encounter for immunization: Secondary | ICD-10-CM

## 2012-10-18 LAB — POCT URINALYSIS DIPSTICK
Glucose, UA: NEGATIVE
Leukocytes, UA: NEGATIVE
Nitrite, UA: NEGATIVE
Protein, UA: NEGATIVE
Urobilinogen, UA: NEGATIVE

## 2012-10-18 NOTE — Progress Notes (Signed)
Pulse- 93 Pt states she has some lower abdomen pressure.  Doing well.

## 2012-10-19 ENCOUNTER — Encounter: Payer: Self-pay | Admitting: Obstetrics & Gynecology

## 2012-10-19 LAB — FERRITIN: Ferritin: 8 ng/mL — ABNORMAL LOW (ref 10–291)

## 2012-10-22 ENCOUNTER — Encounter: Payer: Self-pay | Admitting: Obstetrics & Gynecology

## 2012-10-23 ENCOUNTER — Other Ambulatory Visit: Payer: Self-pay | Admitting: Obstetrics & Gynecology

## 2012-10-24 ENCOUNTER — Ambulatory Visit (INDEPENDENT_AMBULATORY_CARE_PROVIDER_SITE_OTHER): Payer: BC Managed Care – PPO

## 2012-10-24 ENCOUNTER — Other Ambulatory Visit: Payer: Self-pay | Admitting: Obstetrics & Gynecology

## 2012-10-24 ENCOUNTER — Encounter: Payer: Self-pay | Admitting: Obstetrics & Gynecology

## 2012-10-24 DIAGNOSIS — O365990 Maternal care for other known or suspected poor fetal growth, unspecified trimester, not applicable or unspecified: Secondary | ICD-10-CM

## 2012-10-24 DIAGNOSIS — O365931 Maternal care for other known or suspected poor fetal growth, third trimester, fetus 1: Secondary | ICD-10-CM

## 2012-10-24 LAB — US OB DETAIL + 14 WK

## 2012-11-01 ENCOUNTER — Ambulatory Visit (INDEPENDENT_AMBULATORY_CARE_PROVIDER_SITE_OTHER): Payer: Medicaid Other | Admitting: Obstetrics & Gynecology

## 2012-11-01 ENCOUNTER — Encounter: Payer: Self-pay | Admitting: Obstetrics & Gynecology

## 2012-11-01 VITALS — BP 120/83 | Temp 98.4°F | Wt 155.0 lb

## 2012-11-01 DIAGNOSIS — Z3402 Encounter for supervision of normal first pregnancy, second trimester: Secondary | ICD-10-CM

## 2012-11-01 DIAGNOSIS — Z3400 Encounter for supervision of normal first pregnancy, unspecified trimester: Secondary | ICD-10-CM

## 2012-11-01 LAB — POCT URINALYSIS DIPSTICK
Bilirubin, UA: NEGATIVE
Glucose, UA: NEGATIVE
Ketones, UA: NEGATIVE
Leukocytes, UA: NEGATIVE
pH, UA: 8

## 2012-11-01 NOTE — Patient Instructions (Signed)
Newborn Baby Care °BATHING YOUR BABY °· Babies only need a bath 2 to 3 times a week. If you clean up spills and spit up and keep the diaper clean, your baby will not need a bath more often. Do not give your baby a tub bath until the umbilical cord is off and the belly button has normal looking skin. Use a sponge bath only. °· Pick a time of the day when you can relax and enjoy this special time with your baby. Avoid bathing just before or after feedings. °· Wash your hands with warm water and soap. Get all of the needed equipment ready for the baby. °· Equipment includes: °· Basin of warm water (always check to be sure it is not too hot). °· Mild soap and baby shampoo. °· Soft washcloth and towel (may use cloth diaper). °· Cotton balls. °· Clean clothes and blankets. °· Diapers. °· Never leave your baby alone on a high suface where the baby can roll off. °· Always keep 1 hand on your baby when giving a bath. Never leave your baby alone in a bath. °· To keep your baby warm, cover your baby with a cloth except where you are sponge bathing. °· Start the bath by cleansing each eye with a separate corner of the cloth or separate cotton balls. Stroke from the inner corner of the eye to the outer corner, using clear water only. Do not use soap on your baby's face. Then, wash the rest of your baby's face. °· It is not necessary to clean the ears or nose with cotton-tipped swabs. Just wash the outside folds of the ears and nose. If mucus collects in the nose that you can see, it may be removed by twisting a wet cotton ball and wiping the mucus away. Cotton-tipped swabs may injure the tender inside of the nose. °· To wash the head, support the baby's neck and head with your hand. Wet the hair, then shampoo with a small amount of baby shampoo. Rinse thoroughly with warm water from a washcloth. If there is cradle cap, gently loosen the scales with a soft brush before rinsing. °· Continue to wash the rest of the body. Gently  clean in and around all the creases and folds. Remove the soap completely. This will help prevent dry skin. °· For girls, clean between the folds of the labia using a cotton ball soaked with water. Stroke downward. Some babies have a bloody discharge from the vagina (birth canal). This is due to the sudden change of hormones following birth. There may be a white discharge also. Both are normal. For boys, follow circumcision care instructions. °UMBILICAL CORD CARE °The umbilical cord should fall off and heal by 2 to 3 weeks of life. Your newborn should receive only sponge baths until the umbilical cord has fallen off and healed. The umbilical cord and area around the stump do not need specific care, but should be kept clean and dry. If the umbilical stump becomes dirty, it can be cleaned with plain water and dried by placing cloth around the stump. Folding down the front part of the diaper can help dry out the base of the cord. This may make it fall off faster. You may notice a foul odor before it falls off. When the cord comes off and the skin has sealed over the navel, the baby can be placed in a bathtub. Call your caregiver if your baby has:  °· Redness around the umbilical area. °· Swelling   around the umbilical area. °· Discharge from the umbilical stump. °· Pain when you touch the belly. °CIRCUMCISION CARE °· If your baby boy was circumcised: °· There may be a strip of petroleum jelly gauze wrapped around the penis. If so, remove this after 24 hours or sooner if soiled with stool. °· Wash the penis gently with warm water and a soft cloth or cotton ball and dry it. You may apply petroleum jelly to his penis with each diaper change, until the area is well healed. Healing usually takes 2 to 3 days. °· If a plastic ring circumcision was done, gently wash and dry the penis. Apply petroleum jelly several times a day or as directed by your baby's caregiver until healed. The plastic ring at the end of the penis will  loosen around the edges and drop off within 5 to 8 days after the circumcision was done. Do not pull the ring off. °· If the plastic ring has not dropped off after 8 days or if the penis becomes very swollen and has drainage or bright red bleeding, call your caregiver. °· If your baby was not circumcised, do not pull back the foreskin. This will cause pain, as it is not ready to be pulled back. The inside of the foreskin does not need cleaning. Just clean the outer skin. °COLOR °· A small amount of bluishness of the hands and feet is normal for a newborn. Bluish or grayish color of the baby's face or body is not normal. Call for medical help. °· Newborns can have many normal birthmarks on their bodies. Ask your baby's nurse or caregiver about any you find. °· When crying, the newborn's skin color often becomes deep red. This is normal. °· Jaundice is a yellowish color of the skin or in the white part of the baby's eyes. If your baby is becoming jaundiced, call your baby's caregiver. °BOWEL MOVEMENTS °The baby's first bowel movements are sticky, greenish black stools called meconium. The first bowel movement normally occurs within the first 36 hours of life. The stool changes to a mustard-yellow loose stool if the baby is breastfed or a thicker yellow-tan stool if the baby is fed formula. Your baby may make stool after each feeding or 4 to 5 times per day in the first weeks after birth. Each baby is different. After the first month, stools of breastfed babies become less frequent, even fewer than 1 a day. Formula-fed babies tend to have at least 1 stool per day.  °Diarrhea is defined as many watery stools in a day. If the baby has diarrhea you may see a water ring surrounding the stool on the diaper. Constipation is defined as hard stools that seem to be painful for the baby to pass. However, most newborns grunt and strain when passing any stool. This is normal. °GENERAL CARE TIPS  °· Babies should be placed to sleep  on their backs unless your caregiver has suggested otherwise. This is the single most important thing you can do to reduce the risk of sudden infant death syndrome. °· Do not use a pillow when putting the baby to sleep. °· Fingers and toenails should be cut while the baby is sleeping, if possible, and only after you can see a distinct separation between the nail and the skin under it. °· It is not necessary to take the baby's temperature daily. Take it only when you think the skin seems warmer than usual or if the baby seems sick. (Take it   before calling your caregiver.) Lubricate the thermometer with petroleum jelly and insert the bulb end approximately ½ inch into the rectum. Stay with the baby and hold the thermometer in place 2 to 3 minutes by squeezing the cheeks together. °· The disposable bulb syringe used on your baby will be sent home with you. Use it to remove mucus from the nose if your baby gets congested. Squeeze the bulb end together, insert the tip very gently into one nostril, and let the bulb expand. It will suck mucus out of the nostril. Empty the bulb by squeezing out the mucus into a sink. Repeat on the second side. Wash the bulb syringe well with soap and water, and rinse thoroughly after each use. °· Do not over dress the baby. Dress him or her according to the weather. One extra layer more than what you are wearing is a good guideline. If the skin feels warm and damp from perspiring, your baby is too warm and will be restless. °· It is not recommended that you take your infant out in crowded public areas (such as shopping malls) until the baby is several weeks old. In crowds of people, the baby will be exposed to colds, virus, and diseases. Avoid children and adults who are obviously sick. It is good to take the infant out into the fresh air. °· It is not recommended that you take your baby on long-distance trips before your baby is 3 to 4 months old, unless it is necessary. °· Microwaves  should not be used for heating formula. The bottle remains cool, but the formula may become very hot. Reheating breast milk in a microwave reduces or eliminates natural immunity properties of the milk. Many infants will tolerate frozen breast milk that has been thawed to room temperature without additional warming. If necessary, it is more desirable to warm the thawed milk in a bottle placed in a pan of warm water. Be sure to check the temperature of the milk before feeding. °· Wash your hands with hot water and soap after changing the baby's diaper and using the restroom. °· Keep all your baby's doctor appointments and scheduled immunizations. °SEEK MEDICAL CARE IF:  °The cord stump does not fall off by the time the baby is 6 weeks old. °SEEK IMMEDIATE MEDICAL CARE IF:  °· Your baby is 3 months old or younger with a rectal temperature of 100.4° F (38° C) or higher. °· Your baby is older than 3 months with a rectal temperature of 102° F (38.9° C) or higher. °· The baby seems to have little energy or is less active and alert when awake than usual. °· The baby is not eating. °· The baby is crying more than usual or the cry has a different tone or sound to it. °· The baby has vomited more than once (most babies will spit up with burping, which is normal). °· The baby appears to be ill. °· The baby has diaper rash that does not clear up in 3 days after treatment, has sores, pus, or bleeding. °· There is active bleeding at the umbilical cord site. A small amount of spotting is normal. °· There has been no bowel movement in 4 days. °· There is persistent diarrhea or blood in the stool. °· The baby has bluish or gray looking skin. °· There is yellow color to the baby's eyes or skin. °Document Released: 12/25/1999 Document Revised: 03/21/2011 Document Reviewed: 07/16/2007 °[employer]® Patient Information ©2014 [employer]. ° °

## 2012-11-01 NOTE — Progress Notes (Signed)
HR-89 Routine OB, complains of still vomiting once a day, concerned if baby is gaining weight.  Pt reassured.  Nutritionist referral rescheduled.

## 2012-11-06 ENCOUNTER — Other Ambulatory Visit: Payer: Self-pay | Admitting: *Deleted

## 2012-11-06 DIAGNOSIS — O365931 Maternal care for other known or suspected poor fetal growth, third trimester, fetus 1: Secondary | ICD-10-CM

## 2012-11-13 ENCOUNTER — Other Ambulatory Visit: Payer: Self-pay | Admitting: *Deleted

## 2012-11-13 DIAGNOSIS — Z0374 Encounter for suspected problem with fetal growth ruled out: Secondary | ICD-10-CM

## 2012-11-14 ENCOUNTER — Other Ambulatory Visit: Payer: BC Managed Care – PPO

## 2012-11-15 ENCOUNTER — Ambulatory Visit (INDEPENDENT_AMBULATORY_CARE_PROVIDER_SITE_OTHER): Payer: Medicaid Other | Admitting: Obstetrics & Gynecology

## 2012-11-15 VITALS — BP 124/83 | Temp 98.1°F | Wt 159.6 lb

## 2012-11-15 DIAGNOSIS — Z3403 Encounter for supervision of normal first pregnancy, third trimester: Secondary | ICD-10-CM

## 2012-11-15 DIAGNOSIS — Z3400 Encounter for supervision of normal first pregnancy, unspecified trimester: Secondary | ICD-10-CM

## 2012-11-15 LAB — POCT URINALYSIS DIPSTICK
Glucose, UA: NEGATIVE
Ketones, UA: NEGATIVE
Leukocytes, UA: NEGATIVE
Spec Grav, UA: 1.015

## 2012-11-15 NOTE — Progress Notes (Signed)
Pulse- 85 Doing well. 

## 2012-11-16 ENCOUNTER — Encounter: Payer: Self-pay | Admitting: Obstetrics & Gynecology

## 2012-11-16 NOTE — Patient Instructions (Signed)
Third Trimester of Pregnancy  The third trimester is from week 29 through week 42, months 7 through 9. The third trimester is a time when the fetus is growing rapidly. At the end of the ninth month, the fetus is about 20 inches in length and weighs 6 10 pounds.   BODY CHANGES  Your body goes through many changes during pregnancy. The changes vary from woman to woman.    Your weight will continue to increase. You can expect to gain 25 35 pounds (11 16 kg) by the end of the pregnancy.   You may begin to get stretch marks on your hips, abdomen, and breasts.   You may urinate more often because the fetus is moving lower into your pelvis and pressing on your bladder.   You may develop or continue to have heartburn as a result of your pregnancy.   You may develop constipation because certain hormones are causing the muscles that push waste through your intestines to slow down.   You may develop hemorrhoids or swollen, bulging veins (varicose veins).   You may have pelvic pain because of the weight gain and pregnancy hormones relaxing your joints between the bones in your pelvis. Back aches may result from over exertion of the muscles supporting your posture.   Your breasts will continue to grow and be tender. A yellow discharge may leak from your breasts called colostrum.   Your belly button may stick out.   You may feel short of breath because of your expanding uterus.   You may notice the fetus "dropping," or moving lower in your abdomen.   You may have a bloody mucus discharge. This usually occurs a few days to a week before labor begins.   Your cervix becomes thin and soft (effaced) near your due date.  WHAT TO EXPECT AT YOUR PRENATAL EXAMS   You will have prenatal exams every 2 weeks until week 36. Then, you will have weekly prenatal exams. During a routine prenatal visit:   You will be weighed to make sure you and the fetus are growing normally.   Your blood pressure is taken.   Your abdomen will be  measured to track your baby's growth.   The fetal heartbeat will be listened to.   Any test results from the previous visit will be discussed.   You may have a cervical check near your due date to see if you have effaced.  At around 36 weeks, your caregiver will check your cervix. At the same time, your caregiver will also perform a test on the secretions of the vaginal tissue. This test is to determine if a type of bacteria, Group B streptococcus, is present. Your caregiver will explain this further.  Your caregiver may ask you:   What your birth plan is.   How you are feeling.   If you are feeling the baby move.   If you have had any abnormal symptoms, such as leaking fluid, bleeding, severe headaches, or abdominal cramping.   If you have any questions.  Other tests or screenings that may be performed during your third trimester include:   Blood tests that check for low iron levels (anemia).   Fetal testing to check the health, activity level, and growth of the fetus. Testing is done if you have certain medical conditions or if there are problems during the pregnancy.  FALSE LABOR  You may feel small, irregular contractions that eventually go away. These are called Braxton Hicks contractions, or   false labor. Contractions may last for hours, days, or even weeks before true labor sets in. If contractions come at regular intervals, intensify, or become painful, it is best to be seen by your caregiver.   SIGNS OF LABOR    Menstrual-like cramps.   Contractions that are 5 minutes apart or less.   Contractions that start on the top of the uterus and spread down to the lower abdomen and back.   A sense of increased pelvic pressure or back pain.   A watery or bloody mucus discharge that comes from the vagina.  If you have any of these signs before the 37th week of pregnancy, call your caregiver right away. You need to go to the hospital to get checked immediately.  HOME CARE INSTRUCTIONS    Avoid all  smoking, herbs, alcohol, and unprescribed drugs. These chemicals affect the formation and growth of the baby.   Follow your caregiver's instructions regarding medicine use. There are medicines that are either safe or unsafe to take during pregnancy.   Exercise only as directed by your caregiver. Experiencing uterine cramps is a good sign to stop exercising.   Continue to eat regular, healthy meals.   Wear a good support bra for breast tenderness.   Do not use hot tubs, steam rooms, or saunas.   Wear your seat belt at all times when driving.   Avoid raw meat, uncooked cheese, cat litter boxes, and soil used by cats. These carry germs that can cause birth defects in the baby.   Take your prenatal vitamins.   Try taking a stool softener (if your caregiver approves) if you develop constipation. Eat more high-fiber foods, such as fresh vegetables or fruit and whole grains. Drink plenty of fluids to keep your urine clear or pale yellow.   Take warm sitz baths to soothe any pain or discomfort caused by hemorrhoids. Use hemorrhoid cream if your caregiver approves.   If you develop varicose veins, wear support hose. Elevate your feet for 15 minutes, 3 4 times a day. Limit salt in your diet.   Avoid heavy lifting, wear low heal shoes, and practice good posture.   Rest a lot with your legs elevated if you have leg cramps or low back pain.   Visit your dentist if you have not gone during your pregnancy. Use a soft toothbrush to brush your teeth and be gentle when you floss.   A sexual relationship may be continued unless your caregiver directs you otherwise.   Do not travel far distances unless it is absolutely necessary and only with the approval of your caregiver.   Take prenatal classes to understand, practice, and ask questions about the labor and delivery.   Make a trial run to the hospital.   Pack your hospital bag.   Prepare the baby's nursery.   Continue to go to all your prenatal visits as directed  by your caregiver.  SEEK MEDICAL CARE IF:   You are unsure if you are in labor or if your water has broken.   You have dizziness.   You have mild pelvic cramps, pelvic pressure, or nagging pain in your abdominal area.   You have persistent nausea, vomiting, or diarrhea.   You have a bad smelling vaginal discharge.   You have pain with urination.  SEEK IMMEDIATE MEDICAL CARE IF:    You have a fever.   You are leaking fluid from your vagina.   You have spotting or bleeding from your vagina.     You have severe abdominal cramping or pain.   You have rapid weight loss or gain.   You have shortness of breath with chest pain.   You notice sudden or extreme swelling of your face, hands, ankles, feet, or legs.   You have not felt your baby move in over an hour.   You have severe headaches that do not go away with medicine.   You have vision changes.  Document Released: 12/21/2000 Document Revised: 08/29/2012 Document Reviewed: 02/28/2012  [employer] Patient Information 2014 [employer].

## 2012-11-21 ENCOUNTER — Ambulatory Visit (HOSPITAL_COMMUNITY): Admission: RE | Admit: 2012-11-21 | Payer: Medicaid Other | Source: Ambulatory Visit

## 2012-11-21 ENCOUNTER — Other Ambulatory Visit: Payer: BC Managed Care – PPO

## 2012-11-27 ENCOUNTER — Ambulatory Visit (HOSPITAL_COMMUNITY): Admission: RE | Admit: 2012-11-27 | Payer: Medicaid Other | Source: Ambulatory Visit

## 2012-11-27 ENCOUNTER — Ambulatory Visit (HOSPITAL_COMMUNITY)
Admission: RE | Admit: 2012-11-27 | Discharge: 2012-11-27 | Disposition: A | Discharge status: Home / Self Care | Payer: Medicaid Other | Source: Ambulatory Visit | Attending: Obstetrics & Gynecology | Admitting: Obstetrics & Gynecology

## 2012-11-27 ENCOUNTER — Encounter: Payer: Self-pay | Admitting: Obstetrics & Gynecology

## 2012-11-27 ENCOUNTER — Other Ambulatory Visit: Payer: Self-pay | Admitting: Obstetrics & Gynecology

## 2012-11-27 DIAGNOSIS — Z3403 Encounter for supervision of normal first pregnancy, third trimester: Secondary | ICD-10-CM

## 2012-11-27 DIAGNOSIS — Z3689 Encounter for other specified antenatal screening: Secondary | ICD-10-CM | POA: Insufficient documentation

## 2012-11-27 LAB — US OB DETAIL + 14 WK

## 2012-11-28 ENCOUNTER — Other Ambulatory Visit: Payer: BC Managed Care – PPO

## 2012-11-29 ENCOUNTER — Ambulatory Visit (INDEPENDENT_AMBULATORY_CARE_PROVIDER_SITE_OTHER): Payer: Medicaid Other | Admitting: Obstetrics

## 2012-11-29 ENCOUNTER — Encounter: Payer: Self-pay | Admitting: Obstetrics

## 2012-11-29 VITALS — BP 129/90 | Temp 97.4°F | Wt 161.0 lb

## 2012-11-29 DIAGNOSIS — Z3403 Encounter for supervision of normal first pregnancy, third trimester: Secondary | ICD-10-CM

## 2012-11-29 DIAGNOSIS — Z3400 Encounter for supervision of normal first pregnancy, unspecified trimester: Secondary | ICD-10-CM

## 2012-11-29 LAB — POCT URINALYSIS DIPSTICK
Blood, UA: NEGATIVE
Glucose, UA: NEGATIVE
Ketones, UA: NEGATIVE
Protein, UA: NEGATIVE
Spec Grav, UA: <=1.005
Urobilinogen, UA: NEGATIVE

## 2012-11-29 NOTE — Progress Notes (Signed)
Pulse 74  Pt states that she is doing well.

## 2012-11-30 ENCOUNTER — Encounter: Payer: Self-pay | Admitting: Obstetrics

## 2012-12-05 ENCOUNTER — Ambulatory Visit (INDEPENDENT_AMBULATORY_CARE_PROVIDER_SITE_OTHER): Payer: Medicaid Other | Admitting: Obstetrics & Gynecology

## 2012-12-05 VITALS — BP 125/92 | Temp 97.4°F

## 2012-12-05 DIAGNOSIS — Z3400 Encounter for supervision of normal first pregnancy, unspecified trimester: Secondary | ICD-10-CM

## 2012-12-05 DIAGNOSIS — Z3403 Encounter for supervision of normal first pregnancy, third trimester: Secondary | ICD-10-CM

## 2012-12-05 LAB — POCT URINALYSIS DIPSTICK
Bilirubin, UA: NEGATIVE
Ketones, UA: NEGATIVE
Leukocytes, UA: NEGATIVE
Protein, UA: NEGATIVE
Spec Grav, UA: <=1.005

## 2012-12-05 NOTE — Progress Notes (Signed)
Pulse 76 Pt has mild swelling in lower extremities. Pt states that she does have some pain/pressure.

## 2012-12-06 ENCOUNTER — Encounter: Payer: Self-pay | Admitting: Obstetrics & Gynecology

## 2012-12-06 NOTE — Patient Instructions (Signed)
Patient information: Group B streptococcus and pregnancy (Beyond the Basics)  Authors Karen M Puopolo, MD, PhD Carol J Baker, MD Section Editors Charles J Lockwood, MD Daniel J Sexton, MD Deputy Editor Vanessa A Barss, MD Disclosures  All topics are updated as new evidence becomes available and our peer review process is complete.  Literature review current through: Feb 2014.  This topic last updated: Jul 11, 2011.  INTRODUCTION - Group B streptococcus (GBS) is a bacterium that can cause serious infections in pregnant women and newborn babies. GBS is one of many types of streptococcal bacteria, sometimes called "strep." This article discusses GBS, its effect on pregnant women and infants, and ways to prevent complications of GBS. More detailed information about GBS is available by subscription. (See "Group B streptococcal infection in pregnant women".) WHAT IS GROUP B STREP INFECTION? - GBS is commonly found in the digestive system and the vagina. In healthy adults, GBS is not harmful and does not cause problems. But in pregnant women and newborn infants, being infected with GBS can cause serious illness. Approximately one in three to four pregnant women in the [place] carries GBS in their gastrointestinal system and/or in their vagina. Carrying GBS is not the same as being infected. Carriers are not sick and do not need treatment during pregnancy. There is no treatment that can stop you from carrying GBS.  Pregnant women who are carriers of GBS infrequently become infected with GBS. GBS can cause urinary tract infections, infection of the amniotic fluid (bag of water), and infection of the uterus after delivery. GBS infections during pregnancy may lead to preterm labor.  Pregnant women who carry GBS can pass on the bacteria to their newborns, and some of those babies become infected with GBS. Newborns who are infected with GBS can develop pneumonia (lung infection), septicemia (blood infection), or  meningitis (infection of the lining of the brain and spinal cord). These complications can be prevented by giving intravenous antibiotics during labor to any woman who is at risk of GBS infection. You are at risk of GBS infection if: You have a urine culture during your current pregnancy showing GBS  You have a vaginal and rectal culture during your current pregnancy showing GBS  You had an infant infected with GBS in the past GROUP B STREP PREVENTION - Most doctors and nurses recommend a urine culture early in your pregnancy to be sure that you do not have a bladder infection without symptoms. If you urine culture shows GBS or other bacteria, you may be treated with an antibiotic. If you have symptoms of urinary infection, such as pain with urination, any time during your pregnancy, a urine culture is done. If GBS grows from the urine culture, it should be treated with an antibiotic, and you should also receive intravenous antibiotics during labor. Expert groups recommend that all pregnant women have a GBS culture at 35 to 37 weeks of pregnancy. The culture is done by swabbing the vagina and rectum. If your GBS culture is positive, you will be given an intravenous antibiotic during labor. If you have preterm labor, the culture is done then and an intravenous antibiotic is given until the baby is born or the labor is stopped by your health care provider. If you have a positive GBS culture and you have an allergy to penicillin, be sure your doctor and nurse are aware of this allergy and tell them what happened with the allergy. If you had only a rash or itching, this   is not a serious allergy, and you can receive a common drug related to the penicillin. If you had a serious allergy (for example, trouble breathing, swelling of your face) you may need an additional test to determine which antibiotic should be used during labor. Being treated with an antibiotic during labor greatly reduces the chance that you or  your newborn will develop infections related to GBS. It is important to note that young infants up to age 3 months can also develop septicemia, meningitis and other serious infections from GBS. Being treated with an antibiotic during labor does not reduce the chance that your baby will develop this later type of infection. There is currently no known way of preventing this later-onset GBS disease. WHERE TO GET MORE INFORMATION - Your healthcare provider is the best source of information for questions and concerns related to your medical problem.  

## 2012-12-07 LAB — STREP B DNA PROBE: GBSP: NEGATIVE

## 2012-12-13 ENCOUNTER — Ambulatory Visit (INDEPENDENT_AMBULATORY_CARE_PROVIDER_SITE_OTHER): Payer: Medicaid Other | Admitting: Obstetrics & Gynecology

## 2012-12-13 ENCOUNTER — Inpatient Hospital Stay (HOSPITAL_COMMUNITY)
Admission: AD | Admit: 2012-12-13 | Discharge: 2012-12-14 | DRG: 782 | Disposition: A | Discharge status: Home / Self Care | Payer: Medicaid Other | Source: Ambulatory Visit | Attending: Obstetrics | Admitting: Obstetrics

## 2012-12-13 ENCOUNTER — Encounter (HOSPITAL_COMMUNITY): Payer: Self-pay | Admitting: *Deleted

## 2012-12-13 VITALS — BP 150/110 | Temp 98.1°F | Wt 167.0 lb

## 2012-12-13 DIAGNOSIS — O139 Gestational [pregnancy-induced] hypertension without significant proteinuria, unspecified trimester: Secondary | ICD-10-CM | POA: Diagnosis present

## 2012-12-13 DIAGNOSIS — O133 Gestational [pregnancy-induced] hypertension without significant proteinuria, third trimester: Secondary | ICD-10-CM

## 2012-12-13 DIAGNOSIS — O10019 Pre-existing essential hypertension complicating pregnancy, unspecified trimester: Secondary | ICD-10-CM

## 2012-12-13 DIAGNOSIS — Z3403 Encounter for supervision of normal first pregnancy, third trimester: Secondary | ICD-10-CM

## 2012-12-13 DIAGNOSIS — O4700 False labor before 37 completed weeks of gestation, unspecified trimester: Secondary | ICD-10-CM | POA: Diagnosis present

## 2012-12-13 DIAGNOSIS — R072 Precordial pain: Secondary | ICD-10-CM | POA: Diagnosis present

## 2012-12-13 DIAGNOSIS — D509 Iron deficiency anemia, unspecified: Secondary | ICD-10-CM

## 2012-12-13 DIAGNOSIS — Z3402 Encounter for supervision of normal first pregnancy, second trimester: Secondary | ICD-10-CM

## 2012-12-13 DIAGNOSIS — Z3400 Encounter for supervision of normal first pregnancy, unspecified trimester: Secondary | ICD-10-CM

## 2012-12-13 HISTORY — DX: Polycystic ovarian syndrome: E28.2

## 2012-12-13 LAB — POCT URINALYSIS DIPSTICK
Bilirubin, UA: NEGATIVE
Blood, UA: NEGATIVE
Glucose, UA: NEGATIVE
Nitrite, UA: NEGATIVE
Urobilinogen, UA: NEGATIVE

## 2012-12-13 LAB — CBC
MCHC: 32.6 g/dL (ref 30.0–36.0)
MCV: 68.3 fL — ABNORMAL LOW (ref 78.0–100.0)
Platelets: 256 10*3/uL (ref 150–400)
RDW: 22.5 % — ABNORMAL HIGH (ref 11.5–15.5)
WBC: 9.9 10*3/uL (ref 4.0–10.5)

## 2012-12-13 LAB — URINALYSIS, ROUTINE W REFLEX MICROSCOPIC
Leukocytes, UA: NEGATIVE
Nitrite: NEGATIVE
Protein, ur: NEGATIVE mg/dL
Specific Gravity, Urine: 1.01 (ref 1.005–1.030)
Urobilinogen, UA: 0.2 mg/dL (ref 0.0–1.0)

## 2012-12-13 LAB — COMPREHENSIVE METABOLIC PANEL
ALT: 14 U/L (ref 0–35)
AST: 24 U/L (ref 0–37)
Albumin: 2.8 g/dL — ABNORMAL LOW (ref 3.5–5.2)
Alkaline Phosphatase: 425 U/L — ABNORMAL HIGH (ref 39–117)
Chloride: 102 mEq/L (ref 96–112)
Potassium: 3.8 mEq/L (ref 3.5–5.1)
Sodium: 134 mEq/L — ABNORMAL LOW (ref 135–145)
Total Bilirubin: 0.3 mg/dL (ref 0.3–1.2)
Total Protein: 7 g/dL (ref 6.0–8.3)

## 2012-12-13 LAB — URIC ACID: Uric Acid, Serum: 7 mg/dL (ref 2.4–7.0)

## 2012-12-13 LAB — LACTATE DEHYDROGENASE: LDH: 251 U/L — ABNORMAL HIGH (ref 94–250)

## 2012-12-13 LAB — PROTEIN / CREATININE RATIO, URINE
Creatinine, Urine: 29.86 mg/dL
Total Protein, Urine: 6.3 mg/dL

## 2012-12-13 MED ORDER — PRENATAL MULTIVITAMIN CH
1.0000 | ORAL_TABLET | Freq: Every day | ORAL | Status: DC
  Administered 2012-12-14: 1 via ORAL
  Filled 2012-12-13: qty 1

## 2012-12-13 MED ORDER — ZOLPIDEM TARTRATE 5 MG PO TABS: 5.0000 mg | ORAL_TABLET | Freq: Every evening | ORAL | Status: DC | PRN

## 2012-12-13 MED ORDER — LABETALOL HCL 200 MG PO TABS
200.0000 mg | ORAL_TABLET | Freq: Once | ORAL | Status: AC
  Administered 2012-12-14: 200 mg via ORAL

## 2012-12-13 MED ORDER — CALCIUM CARBONATE ANTACID 500 MG PO CHEW: 2.0000 | CHEWABLE_TABLET | ORAL | Status: DC | PRN

## 2012-12-13 MED ORDER — LABETALOL HCL 100 MG PO TABS
200.0000 mg | ORAL_TABLET | Freq: Three times a day (TID) | ORAL | Status: DC
  Administered 2012-12-14 (×2): 200 mg via ORAL
  Filled 2012-12-13 (×5): qty 2

## 2012-12-13 MED ORDER — DOCUSATE SODIUM 100 MG PO CAPS
100.0000 mg | ORAL_CAPSULE | Freq: Every day | ORAL | Status: DC
  Administered 2012-12-14: 100 mg via ORAL
  Filled 2012-12-13: qty 1

## 2012-12-13 MED ORDER — ACETAMINOPHEN 325 MG PO TABS: 650.0000 mg | ORAL_TABLET | ORAL | Status: DC | PRN

## 2012-12-13 NOTE — MAU Provider Note (Signed)
History     CSN: [id number]  Arrival date and time: 12/13/12 4098   First Provider Initiated Contact with Patient 12/13/12 1858      Chief Complaint  Patient presents with  . Hypertension   HPI Ms. Candies Palm is a 27 y.o. G1P0 at [redacted]w[redacted]d who presents to MAU today from the office with elevated BPs. The patient denies headache, blurred vision, seeing spots, abdominal pain, contractions or vaginal bleeding. She does have mild bilateral LE edema. She reports good fetal movement. She also complains of intermittent substernal chest pain since Friday.    OB History   Grav Para Term Preterm Abortions TAB SAB Ect Mult Living   1               Past Medical History  Diagnosis Date  . No pertinent past medical history   . Medical history non-contributory   . PCOS (polycystic ovarian syndrome)     Past Surgical History  Procedure Laterality Date  . No past surgeries      Family History  Problem Relation Age of Onset  . Alcohol abuse Neg Hx   . Arthritis Neg Hx   . Asthma Neg Hx   . Birth defects Neg Hx   . Cancer Neg Hx   . COPD Neg Hx   . Depression Neg Hx   . Drug abuse Neg Hx   . Early death Neg Hx   . Hearing loss Neg Hx   . Heart disease Neg Hx   . Hyperlipidemia Neg Hx   . Hypertension Neg Hx   . Kidney disease Neg Hx   . Learning disabilities Neg Hx   . Mental illness Neg Hx   . Mental retardation Neg Hx   . Miscarriages / Stillbirths Neg Hx   . Stroke Neg Hx   . Vision loss Neg Hx   . Diabetes Mother     History  Substance Use Topics  . Smoking status: Never Smoker   . Smokeless tobacco: Never Used  . Alcohol Use: No    Allergies: No Known Allergies  Prescriptions prior to admission  Medication Sig Dispense Refill  . cholecalciferol (VITAMIN D) 1000 UNITS tablet Take 1,000 Units by mouth daily.      . Iron-FA-B Cmp-C-Biot-Probiotic (FUSION PLUS) CAPS Take 1 capsule by mouth daily.  30 capsule  6  . Prenatal Vit-Fe Fumarate-FA (PRENATAL  MULTIVITAMIN) TABS Take 1 tablet by mouth daily at 12 noon.        Review of Systems  Constitutional: Negative for fever and malaise/fatigue.  Eyes: Negative for blurred vision and double vision.  Cardiovascular: Positive for chest pain.  Gastrointestinal: Negative for abdominal pain.  Genitourinary:       Neg - vaginal bleeding  Neurological: Negative for dizziness and headaches.   Physical Exam   Blood pressure 144/83, pulse 70, temperature 97.8 F (36.6 C), temperature source Oral, resp. rate 18, last menstrual period 04/02/2012.  Patient Vitals for the past 24 hrs:  BP Temp Temp src Pulse Resp  12/13/12 2000 144/83 mmHg - - 70 -  12/13/12 1945 128/92 mmHg - - 89 -  12/13/12 1934 131/93 mmHg - - 66 -  12/13/12 1915 153/98 mmHg - - 67 -  12/13/12 1900 163/102 mmHg - - 67 -  12/13/12 1856 128/98 mmHg - - 88 -  12/13/12 1851 135/101 mmHg 97.8 F (36.6 C) Oral 85 18    Physical Exam  Constitutional: She is oriented to person, place, and  time. She appears well-developed and well-nourished. No distress.  HENT:  Head: Normocephalic and atraumatic.  Cardiovascular: Normal rate, regular rhythm and normal heart sounds.   Respiratory: Effort normal and breath sounds normal. No respiratory distress.  GI: Soft. She exhibits no distension and no mass. There is no tenderness. There is no rebound and no guarding.  Musculoskeletal: She exhibits edema (non-pitting LE edema bilateral).  Neurological: She is alert and oriented to person, place, and time. She has normal reflexes.  No clonus  Skin: Skin is warm and dry. No erythema.  Psychiatric: She has a normal mood and affect.   Results for orders placed during the hospital encounter of 12/13/12 (from the past 24 hour(s))  URINALYSIS, ROUTINE W REFLEX MICROSCOPIC     Status: None   Collection Time    12/13/12  6:00 PM      Result Value Range   Color, Urine YELLOW  YELLOW   APPearance CLEAR  CLEAR   Specific Gravity, Urine 1.010   1.005 - 1.030   pH 7.0  5.0 - 8.0   Glucose, UA NEGATIVE  NEGATIVE mg/dL   Hgb urine dipstick NEGATIVE  NEGATIVE   Bilirubin Urine NEGATIVE  NEGATIVE   Ketones, ur NEGATIVE  NEGATIVE mg/dL   Protein, ur NEGATIVE  NEGATIVE mg/dL   Urobilinogen, UA 0.2  0.0 - 1.0 mg/dL   Nitrite NEGATIVE  NEGATIVE   Leukocytes, UA NEGATIVE  NEGATIVE  CBC     Status: Abnormal   Collection Time    12/13/12  7:34 PM      Result Value Range   WBC 9.9  4.0 - 10.5 K/uL   RBC 5.17 (*) 3.87 - 5.11 MIL/uL   Hemoglobin 11.5 (*) 12.0 - 15.0 g/dL   HCT 81.1 (*) 91.4 - 78.2 %   MCV 68.3 (*) 78.0 - 100.0 fL   MCH 22.2 (*) 26.0 - 34.0 pg   MCHC 32.6  30.0 - 36.0 g/dL   RDW 95.6 (*) 21.3 - 08.6 %   Platelets 256  150 - 400 K/uL  COMPREHENSIVE METABOLIC PANEL     Status: Abnormal   Collection Time    12/13/12  7:34 PM      Result Value Range   Sodium 134 (*) 135 - 145 mEq/L   Potassium 3.8  3.5 - 5.1 mEq/L   Chloride 102  96 - 112 mEq/L   CO2 20  19 - 32 mEq/L   Glucose, Bld 74  70 - 99 mg/dL   BUN 7  6 - 23 mg/dL   Creatinine, Ser 5.78  0.50 - 1.10 mg/dL   Calcium 9.1  8.4 - 46.9 mg/dL   Total Protein 7.0  6.0 - 8.3 g/dL   Albumin 2.8 (*) 3.5 - 5.2 g/dL   AST 24  0 - 37 U/L   ALT 14  0 - 35 U/L   Alkaline Phosphatase 425 (*) 39 - 117 U/L   Total Bilirubin 0.3  0.3 - 1.2 mg/dL   GFR calc non Af Amer 89 (*) >90 mL/min   GFR calc Af Amer >90  >90 mL/min  LACTATE DEHYDROGENASE     Status: Abnormal   Collection Time    12/13/12  7:34 PM      Result Value Range   LDH 251 (*) 94 - 250 U/L  URIC ACID     Status: None   Collection Time    12/13/12  7:34 PM      Result Value Range  Uric Acid, Serum 7.0  2.4 - 7.0 mg/dL   Fetal Monitoring:  Baseline: 120 bpm, moderate variability, + accelerations, no decelerations Contractions: q 3-5 minutes, mildly palpable  EKG==normal sinus rhythm.  MAU Course  Procedures None  MDM Discussed with Dr. Clearance Coots. CBC, CMP, Uric Acid, LDH and urine protein/creatinine  ratio today EKG ordered 1940 - Labs pending. Care turned over to Jeani Sow, NP 20:40  Micah Flesher to check on patient. Patient states her chest pain has resolved.  Reported to her the EKG was normal.  Waiting to Pro/Creat Ratio.   Care turned over to [facility] Mayford Knife, CNM at 20:50  Freddi Starr, PA-C  12/13/2012, 7:28 PM  Assessment and Plan    Results for orders placed during the hospital encounter of 12/13/12 (from the past 24 hour(s))  URINALYSIS, ROUTINE W REFLEX MICROSCOPIC     Status: None   Collection Time    12/13/12  6:00 PM      Result Value Range   Color, Urine YELLOW  YELLOW   APPearance CLEAR  CLEAR   Specific Gravity, Urine 1.010  1.005 - 1.030   pH 7.0  5.0 - 8.0   Glucose, UA NEGATIVE  NEGATIVE mg/dL   Hgb urine dipstick NEGATIVE  NEGATIVE   Bilirubin Urine NEGATIVE  NEGATIVE   Ketones, ur NEGATIVE  NEGATIVE mg/dL   Protein, ur NEGATIVE  NEGATIVE mg/dL   Urobilinogen, UA 0.2  0.0 - 1.0 mg/dL   Nitrite NEGATIVE  NEGATIVE   Leukocytes, UA NEGATIVE  NEGATIVE  PROTEIN / CREATININE RATIO, URINE     Status: Abnormal   Collection Time    12/13/12  6:40 PM      Result Value Range   Creatinine, Urine 29.86     Total Protein, Urine 6.3     PROTEIN CREATININE RATIO 0.21 (*) 0.00 - 0.15  CBC     Status: Abnormal   Collection Time    12/13/12  7:34 PM      Result Value Range   WBC 9.9  4.0 - 10.5 K/uL   RBC 5.17 (*) 3.87 - 5.11 MIL/uL   Hemoglobin 11.5 (*) 12.0 - 15.0 g/dL   HCT 40.9 (*) 81.1 - 91.4 %   MCV 68.3 (*) 78.0 - 100.0 fL   MCH 22.2 (*) 26.0 - 34.0 pg   MCHC 32.6  30.0 - 36.0 g/dL   RDW 78.2 (*) 95.6 - 21.3 %   Platelets 256  150 - 400 K/uL  COMPREHENSIVE METABOLIC PANEL     Status: Abnormal   Collection Time    12/13/12  7:34 PM      Result Value Range   Sodium 134 (*) 135 - 145 mEq/L   Potassium 3.8  3.5 - 5.1 mEq/L   Chloride 102  96 - 112 mEq/L   CO2 20  19 - 32 mEq/L   Glucose, Bld 74  70 - 99 mg/dL   BUN 7  6 - 23 mg/dL   Creatinine, Ser 0.86  0.50 -  1.10 mg/dL   Calcium 9.1  8.4 - 57.8 mg/dL   Total Protein 7.0  6.0 - 8.3 g/dL   Albumin 2.8 (*) 3.5 - 5.2 g/dL   AST 24  0 - 37 U/L   ALT 14  0 - 35 U/L   Alkaline Phosphatase 425 (*) 39 - 117 U/L   Total Bilirubin 0.3  0.3 - 1.2 mg/dL   GFR calc non Af Amer 89 (*) >90 mL/min  GFR calc Af Amer >90  >90 mL/min  LACTATE DEHYDROGENASE     Status: Abnormal   Collection Time    12/13/12  7:34 PM      Result Value Range   LDH 251 (*) 94 - 250 U/L  URIC ACID     Status: None   Collection Time    12/13/12  7:34 PM      Result Value Range   Uric Acid, Serum 7.0  2.4 - 7.0 mg/dL   Filed Vitals:   30/86/57 2030 12/13/12 2045 12/13/12 2100 12/13/12 2115  BP: 127/86 149/90 154/85 125/85  Pulse: 76 92 63 82  Temp:      TempSrc:      Resp:        Discussed with Dr Clearance Coots Due to elevated Pr/Cr ratio and earlier severe range BPs, will admit to Antenatal and do 24 hr urine Consult MFM in am

## 2012-12-13 NOTE — H&P (Signed)
Jordan Holt is a 27 y.o. female presenting for elevated BP. Maternal Medical History:  Reason for admission: 27 yo G1.  EDC 01-07-13.  Seen in office with elevated BP.  Sent to [facility] for further evaluation.  Denies HA, visual changes.  Fetal activity: Perceived fetal activity is normal.   Last perceived fetal movement was within the past hour.    Prenatal complications: no prenatal complications Prenatal Complications - Diabetes: none.    OB History   Grav Para Term Preterm Abortions TAB SAB Ect Mult Living   1              Past Medical History  Diagnosis Date  . No pertinent past medical history   . Medical history non-contributory   . PCOS (polycystic ovarian syndrome)    Past Surgical History  Procedure Laterality Date  . No past surgeries     Family History: family history includes Diabetes in her mother. There is no history of Alcohol abuse, Arthritis, Asthma, Birth defects, Cancer, COPD, Depression, Drug abuse, Early death, Hearing loss, Heart disease, Hyperlipidemia, Hypertension, Kidney disease, Learning disabilities, Mental illness, Mental retardation, Miscarriages / Stillbirths, Stroke, or Vision loss. Social History:  reports that she has never smoked. She has never used smokeless tobacco. She reports that she does not drink alcohol or use illicit drugs.   Prenatal Transfer Tool  Maternal Diabetes: No Genetic Screening: Normal Maternal Ultrasounds/Referrals: Normal Fetal Ultrasounds or other Referrals:  None Maternal Substance Abuse:  No Significant Maternal Medications:  None Significant Maternal Lab Results:  None Other Comments:  None  Review of Systems  All other systems reviewed and are negative.      Blood pressure 125/85, pulse 82, temperature 97.8 F (36.6 C), temperature source Oral, resp. rate 18, last menstrual period 04/02/2012. Maternal Exam:  Abdomen: Patient reports no abdominal tenderness.   Physical Exam  Nursing note and vitals  reviewed. Constitutional: She is oriented to person, place, and time. She appears well-developed and well-nourished.  HENT:  Head: Normocephalic and atraumatic.  Eyes: Conjunctivae are normal. Pupils are equal, round, and reactive to light.  Neck: Normal range of motion. Neck supple.  Cardiovascular: Normal rate and regular rhythm.   Respiratory: Effort normal and breath sounds normal.  GI: Soft.  Musculoskeletal: Normal range of motion.  Neurological: She is alert and oriented to person, place, and time. She has normal reflexes.  Skin: Skin is warm and dry.  Psychiatric: She has a normal mood and affect. Her behavior is normal. Judgment and thought content normal.      Prenatal labs:   ABO, Rh: O/POS/-- (07/21 1327) Antibody: NEG (07/21 1327) Rubella: 2.31 (07/21 1327) RPR: NON REAC (09/11 1128)  HBsAg: NEGATIVE (07/21 1327)  HIV: NON REACTIVE (09/11 1128)  GBS: NEGATIVE (11/26 1720)   Assessment/Plan: 36.4 weeks.  PIH.  Stable.  Admit.  24 hour urine.  MFM Consult.   Krislynn Gronau A 12/13/2012, 10:25 PM

## 2012-12-13 NOTE — Progress Notes (Deleted)
  Subjective:    Jordan Holt is a 27 y.o. G1P0 [redacted]w[redacted]d being seen today for her obstetrical visit.  Patient reports {sx:14538}. Fetal movement: {fetal movement:14572}.  Objective:    BP 150/110  Temp(Src) 98.1 F (36.7 C)  Wt 75.751 kg (167 lb)  LMP 04/02/2012  Physical Exam  Exam  FHT:    Uterine Size:    Presentation:       Assessment:    Pregnancy:  G1P0    Plan:    Patient Active Problem List   Diagnosis Date Noted  . Iron (Fe) deficiency anemia 08/07/2012  . Supervision of normal first pregnancy 07/30/2012  . DUB (dysfunctional uterine bleeding) 03/09/2012    {ob counseling:14517} Follow up in {follow up OB:14565}.     {*** HELP TEXT ***  This SmartLink requires parameters for processing. Parameters are variables that can be added to the original SmartLink name. This allows the user to request specific information by giving the SmartLink precise direction.  The lastlab SmartLink accepts a list of result component base names separated by commas. The user can also request the number of results to display for each component. To indicate the number of results for each component, type the component name followed by a colon and then the number of results. (Component name:4). For all results for that particular component, type a star in place of the number (Component name:*). To obtain the last result for a particular component, type the component base name without the colon, number, or star. Ex: .lastlab[RBC. Depending on how the SmartLink is setup, results may be limited by a lookback period. In this case, any results older than the lookback period will not be displayed.  The SmartLink will display the name of the component(s) with the units used next to the name followed by a table listing the date, result value, low and high reference range, and the result status for each result requested.  Example: .lastlab[MCH:3,MCV:*,HCT   This example would display a table for  all components whose base name matches '[facility]', 'MCV', or 'HCT'. The first would contain the last three results of each component that has a base name of [facility], the second would contain all the entered results for components with MCV as the base name, and the third would contain the last entered result for all components with a base name of HCT.}

## 2012-12-13 NOTE — MAU Note (Signed)
EKG FINISHED

## 2012-12-13 NOTE — Progress Notes (Signed)
Pulse 69 Pt states that she has been having some chest pain since Friday 12/07/12.  Pt states that she is also having lower back pain.  To [facility] for further evaluation of possible severe PIH.

## 2012-12-13 NOTE — MAU Note (Signed)
Sent from office for PIH eval.  BP's have been increasing.  Increased swelling noted in feet.  Denies HA, visual changes or epigastric pain

## 2012-12-14 ENCOUNTER — Inpatient Hospital Stay (HOSPITAL_COMMUNITY): Payer: Medicaid Other

## 2012-12-14 NOTE — Progress Notes (Signed)
MD made aware of HFR tracing, 10x10 accels and mild variables.  Pt waiting for u/s

## 2012-12-14 NOTE — Progress Notes (Signed)
UR completed 

## 2012-12-14 NOTE — Progress Notes (Signed)
Patient still off unit for ultrasound in MFM.

## 2012-12-14 NOTE — Progress Notes (Signed)
Patient given and signed discharge paperwork. All questions answered. No IV access. Patient would like to stay and eat her dinner before leaving.

## 2012-12-14 NOTE — Progress Notes (Signed)
Monitors off after reassuring FHR and discharge home orders given

## 2012-12-14 NOTE — Progress Notes (Signed)
Patient discharged with paper work and belongings via wheelchair to front entrance. All questions answered.

## 2012-12-14 NOTE — Discharge Summary (Signed)
  Physician Discharge Summary  Patient ID: Jordan Holt MRN: [id number] DOB/AGE: 03/01/1985 27 y.o.  Admit date: 12/13/2012 Discharge date: 12/14/2012  Admission Diagnoses: Active Problems:   PIH (pregnancy induced hypertension)  Discharge Diagnoses:  Active Problems:   PIH (pregnancy induced hypertension)   Discharged Condition: stable  Hospital Course: She was started on oral labetalol.  The blood pressures were less labile with a good response to bed rest.  Fetal testing was reassuring with a BPP of 8/10 on the day of discharge.  An U/S was remarkable for a lagging AC; U/A dopplers were normal.  The patient remained asymptomatic. There was no significant proteinuria; labs were not consistent with a HELLP syndrome picture.  Consults: MFM  Significant Diagnostic Studies: labs: PIH panel and radiology: Ultrasound: obstetrical  Treatments: none  Discharge Exam: Blood pressure 133/88, pulse 92, temperature 97.9 F (36.6 C), temperature source Oral, resp. rate 18, height 5\' 7"  (1.702 m), weight 74.39 kg (164 lb), last menstrual period 04/02/2012. General appearance: alert GI: soft, non-tender; bowel sounds normal; no masses,  no organomegaly Extremities: extremities normal, atraumatic, no cyanosis or edema  Disposition: 01-Home or Self Care  Discharge Orders   Future Appointments Provider Department Dept Phone   12/20/2012 5:30 PM Antionette Char, MD [facility] [phone]   Future Orders Complete By Expires   Discharge activity: Bedrest  As directed    Discharge diet:  No restrictions  As directed    No sexual activity restrictions  As directed        Medication List         cholecalciferol 1000 UNITS tablet  Commonly known as:  VITAMIN D  Take 1,000 Units by mouth daily.     FUSION PLUS Caps  Take 1 capsule by mouth daily.     prenatal multivitamin Tabs tablet  Take 1 tablet by mouth daily at 12 noon.           Follow-up Information   Follow  up with Roseanna Rainbow, MD On 12/17/2012.   Specialty:  Obstetrics and Gynecology   Contact information:   [address] [place] [address] [phone]       Signed: Roseanna Rainbow 12/14/2012, 5:51 PM

## 2012-12-14 NOTE — Progress Notes (Signed)
Pt getting up to the bathroom

## 2012-12-17 ENCOUNTER — Encounter (HOSPITAL_COMMUNITY): Payer: Self-pay | Admitting: *Deleted

## 2012-12-17 ENCOUNTER — Ambulatory Visit (INDEPENDENT_AMBULATORY_CARE_PROVIDER_SITE_OTHER): Payer: Medicaid Other | Admitting: Obstetrics & Gynecology

## 2012-12-17 ENCOUNTER — Telehealth (HOSPITAL_COMMUNITY): Payer: Self-pay | Admitting: *Deleted

## 2012-12-17 VITALS — BP 121/86 | Temp 98.7°F | Wt 163.0 lb

## 2012-12-17 DIAGNOSIS — Z3400 Encounter for supervision of normal first pregnancy, unspecified trimester: Secondary | ICD-10-CM

## 2012-12-17 DIAGNOSIS — Z3403 Encounter for supervision of normal first pregnancy, third trimester: Secondary | ICD-10-CM

## 2012-12-17 DIAGNOSIS — O10019 Pre-existing essential hypertension complicating pregnancy, unspecified trimester: Secondary | ICD-10-CM

## 2012-12-17 LAB — POCT URINALYSIS DIPSTICK
Bilirubin, UA: NEGATIVE
Blood, UA: NEGATIVE
Glucose, UA: NEGATIVE
Ketones, UA: NEGATIVE
Leukocytes, UA: NEGATIVE
Nitrite, UA: NEGATIVE
pH, UA: 7

## 2012-12-17 NOTE — Telephone Encounter (Signed)
Preadmission screen  

## 2012-12-17 NOTE — Patient Instructions (Signed)
Early Elective Birth Early elective birth refers to making a choice to have a baby before the time the baby is due. The length of a pregnancy is 9 months, or 40 weeks starting from the beginning of a woman's last menstrual period (LMP). Most women naturally go into labor around 40 weeks. A "full-term" pregnancy is considered between 47 and [redacted] weeks gestation age. Currently, early elective births can take place sometime after 39 weeks. Most caregivers practice within the guidelines of delivering a baby no later than 42 weeks and no earlier than 39 weeks. There are always exceptions to this time interval, and the risks involved to the mother and baby need to be considered in those cases.  Induction of labor refers to the use of medicines to bring about contractions. "Labor" is when the cervix starts to widen (dilate). "Active labor" is when there are contractions and the cervix has dilated to at least 4 cm. Often times, the earlier a mother is in her pregnancy, the longer it takes to get induced. When the cervix is ready (dilated and soft), an induction may take less than a day. However, when a cervix is far away from being ready (long, closed, and firm), it may take days in a hospital for labor to start.  Currently, 39 weeks is considered the earliest a caregivershould start the induction process. This is because the longer the baby stays inside the uterus, the lower the risks are to both the baby and mother. However, sometimes there are very good reasons for a pregnancy to be induced before [redacted] weeks gestation. These exceptions are specific to each individual pregnancy and need to be considered on a case-by-case basis. A good reason to induce one pregnancy may not be good a good reason for another pregnancy.  REASONS FOR ELECTIVE BIRTH It may be safer to induce labor before 39 weeks if:   A woman is carrying more than 1 baby. Current standards are to deliver twin pregnancies at 38 weeks.  A woman is  having complications, such as:  High blood pressure caused by pregnancy (preeclampsia).  Bleeding.  Infection.  There are conditions affecting the baby's health, such as:  Intrauterine growth restriction (IUGR), where the baby is not growing well.  Having abnormal fetal heart rate patterns on the monitor (nonreassuring tracing).  Having a lack of fluid that surrounds the baby (oligohydramnios).  Having placental issues.  Fluid that surrounds the baby (amniotic fluid) is leaking. There are many other safety reasons that a pregnancy may need to be induced early. REASONS AGAINST ELECTIVE BIRTH Sometimes early elective birth is not the best choice. It may not be a good idea if:   An early birth is just more convenient.  You want the baby to be born on a certain date, like a holiday.  You are more likely to need a cesarean delivery before 39 weeks. A cesarean delivery can lead to other problems. Problems include infection, bleeding, and not having enough iron in your blood (anemia), which can cause weakness.  Babies born early (25 to 37 weeks premature):  May need special care at the hospital or in a special care nursery.  Are at a greater risk for:  Brain damage.  Feeding problems.  Breathing problems.  Slow physical and mental development.  May need special care in a neonatal intensive care unit (NICU), but this is rare. The length of the baby's stay in the hospital will depend on how quickly he or she progresses to  a safe level of care.  Are at a greater risk for:  Infection.  Bleeding inside the brain.  Dying during their first year of life. REDUCING EARLY ELECTIVE BIRTHS Carrying a baby longer than 42 weeks is not good for the baby or the mother. A full-term pregnancy is best for baby and mother. Anything earlier can be risky for you and your baby. Remember:  An early elective birth may lead to a cesarean delivery. This can lead to other problems for the mother  and baby.  An early elective birth can result in developmental problems for your child.  A baby's brain continues to develop while in the uterus.  A baby's body continues to develop. The baby will be better able to breathe and eat when he or she is born near the due date.  A baby who stays in the uterus longer responds better. The baby will also bond better with you. Document Released: 09/08/2010 Document Revised: 03/21/2011 Document Reviewed: 07/26/2012 [facility] Patient Information 2014 [place], [place].

## 2012-12-17 NOTE — Progress Notes (Signed)
HR - 83 Pt in office today for routine OB visit, BP elevated this visit, denies vision changes, headache, SOB, chest pain, reports having low back pain.  For IOL this week.

## 2012-12-18 ENCOUNTER — Other Ambulatory Visit: Payer: Self-pay | Admitting: *Deleted

## 2012-12-18 ENCOUNTER — Encounter: Payer: Self-pay | Admitting: Obstetrics & Gynecology

## 2012-12-18 ENCOUNTER — Encounter (HOSPITAL_COMMUNITY): Payer: Self-pay

## 2012-12-18 ENCOUNTER — Inpatient Hospital Stay (HOSPITAL_COMMUNITY)
Admission: RE | Admit: 2012-12-18 | Discharge: 2012-12-21 | DRG: 775 | Disposition: A | Discharge status: Home / Self Care | Payer: Medicaid Other | Source: Ambulatory Visit | Attending: Obstetrics | Admitting: Obstetrics

## 2012-12-18 DIAGNOSIS — D649 Anemia, unspecified: Secondary | ICD-10-CM | POA: Diagnosis not present

## 2012-12-18 DIAGNOSIS — O9903 Anemia complicating the puerperium: Secondary | ICD-10-CM | POA: Diagnosis not present

## 2012-12-18 DIAGNOSIS — Z3402 Encounter for supervision of normal first pregnancy, second trimester: Secondary | ICD-10-CM

## 2012-12-18 DIAGNOSIS — D509 Iron deficiency anemia, unspecified: Secondary | ICD-10-CM

## 2012-12-18 DIAGNOSIS — O139 Gestational [pregnancy-induced] hypertension without significant proteinuria, unspecified trimester: Principal | ICD-10-CM | POA: Diagnosis present

## 2012-12-18 LAB — CBC
Hemoglobin: 10.8 g/dL — ABNORMAL LOW (ref 12.0–15.0)
MCH: 22.7 pg — ABNORMAL LOW (ref 26.0–34.0)
MCHC: 33.3 g/dL (ref 30.0–36.0)
MCV: 68.1 fL — ABNORMAL LOW (ref 78.0–100.0)
Platelets: 247 10*3/uL (ref 150–400)
RBC: 4.76 MIL/uL (ref 3.87–5.11)
WBC: 10.4 10*3/uL (ref 4.0–10.5)

## 2012-12-18 LAB — COMPREHENSIVE METABOLIC PANEL
Alkaline Phosphatase: 418 U/L — ABNORMAL HIGH (ref 39–117)
BUN: 10 mg/dL (ref 6–23)
CO2: 19 mEq/L (ref 19–32)
Calcium: 8.9 mg/dL (ref 8.4–10.5)
Chloride: 102 mEq/L (ref 96–112)
GFR calc Af Amer: >90 mL/min (ref >90)
GFR calc non Af Amer: 80 mL/min — ABNORMAL LOW (ref >90)
Glucose, Bld: 79 mg/dL (ref 70–99)
Potassium: 4.1 mEq/L (ref 3.5–5.1)
Sodium: 133 mEq/L — ABNORMAL LOW (ref 135–145)
Total Protein: 6.7 g/dL (ref 6.0–8.3)

## 2012-12-18 MED ORDER — LACTATED RINGERS IV SOLN
INTRAVENOUS | Status: DC
  Administered 2012-12-18: 21:00:00 via INTRAVENOUS

## 2012-12-18 MED ORDER — TERBUTALINE SULFATE 1 MG/ML IJ SOLN: 0.2500 mg | Freq: Once | INTRAMUSCULAR | Status: AC | PRN

## 2012-12-18 MED ORDER — OXYCODONE-ACETAMINOPHEN 5-325 MG PO TABS: 1.0000 | ORAL_TABLET | ORAL | Status: DC | PRN

## 2012-12-18 MED ORDER — BUTORPHANOL TARTRATE 1 MG/ML IJ SOLN: 1.0000 mg | INTRAMUSCULAR | Status: DC | PRN

## 2012-12-18 MED ORDER — OXYTOCIN BOLUS FROM INFUSION: 500.0000 mL | INTRAVENOUS | Status: DC

## 2012-12-18 MED ORDER — LACTATED RINGERS IV SOLN: 500.0000 mL | INTRAVENOUS | Status: DC | PRN

## 2012-12-18 MED ORDER — MAGNESIUM SULFATE 40 G IN LACTATED RINGERS - SIMPLE
2.0000 g/h | INTRAVENOUS | Status: DC
  Administered 2012-12-19: 2 g/h via INTRAVENOUS
  Filled 2012-12-18 (×2): qty 500

## 2012-12-18 MED ORDER — ZOLPIDEM TARTRATE 5 MG PO TABS
5.0000 mg | ORAL_TABLET | Freq: Every evening | ORAL | Status: DC | PRN
Start: 2012-12-18 — End: 2012-12-19

## 2012-12-18 MED ORDER — CITRIC ACID-SODIUM CITRATE 334-500 MG/5ML PO SOLN: 30.0000 mL | ORAL | Status: DC | PRN

## 2012-12-18 MED ORDER — ONDANSETRON HCL 4 MG/2ML IJ SOLN: 4.0000 mg | Freq: Four times a day (QID) | INTRAMUSCULAR | Status: DC | PRN

## 2012-12-18 MED ORDER — LABETALOL HCL 5 MG/ML IV SOLN: 20.0000 mg | Freq: Once | INTRAVENOUS | Status: DC

## 2012-12-18 MED ORDER — LIDOCAINE HCL (PF) 1 % IJ SOLN
30.0000 mL | INTRAMUSCULAR | Status: AC | PRN
Start: 2012-12-18 — End: 2012-12-21
  Administered 2012-12-19: 30 mL via SUBCUTANEOUS
  Filled 2012-12-18 (×2): qty 30

## 2012-12-18 MED ORDER — MISOPROSTOL 25 MCG QUARTER TABLET
25.0000 ug | ORAL_TABLET | ORAL | Status: DC | PRN
  Administered 2012-12-18: 25 ug via VAGINAL
  Filled 2012-12-18: qty 1
  Filled 2012-12-18: qty 0.25

## 2012-12-18 MED ORDER — MAGNESIUM SULFATE BOLUS VIA INFUSION
4.0000 g | Freq: Once | INTRAVENOUS | Status: AC
  Administered 2012-12-18: 4 g via INTRAVENOUS
  Filled 2012-12-18: qty 500

## 2012-12-18 MED ORDER — ACETAMINOPHEN 325 MG PO TABS: 650.0000 mg | ORAL_TABLET | ORAL | Status: DC | PRN

## 2012-12-18 MED ORDER — HYDROXYZINE HCL 50 MG PO TABS
50.0000 mg | ORAL_TABLET | Freq: Four times a day (QID) | ORAL | Status: DC | PRN
  Filled 2012-12-18: qty 1

## 2012-12-18 MED ORDER — IBUPROFEN 600 MG PO TABS
600.0000 mg | ORAL_TABLET | Freq: Four times a day (QID) | ORAL | Status: DC | PRN
  Administered 2012-12-19: 600 mg via ORAL
  Filled 2012-12-18: qty 1

## 2012-12-18 MED ORDER — OXYTOCIN 40 UNITS IN LACTATED RINGERS INFUSION - SIMPLE MED
62.5000 mL/h | INTRAVENOUS | Status: DC
  Administered 2012-12-19: 62.5 mL/h via INTRAVENOUS
  Filled 2012-12-18 (×2): qty 1000

## 2012-12-18 NOTE — H&P (Signed)
Jordan Holt is a 27 y.o. female presenting for IOL. Maternal Medical History:  Reason for admission: 27 yo G1.  EDC 01-07-13.  Presents for IOL for PIH.  Fetal activity: Perceived fetal activity is normal.    Prenatal complications: PIH.     OB History   Grav Para Term Preterm Abortions TAB SAB Ect Mult Living   1              Past Medical History  Diagnosis Date  . No pertinent past medical history   . PCOS (polycystic ovarian syndrome)    Past Surgical History  Procedure Laterality Date  . No past surgeries     Family History: family history includes Diabetes in her mother. There is no history of Alcohol abuse, Arthritis, Asthma, Birth defects, Cancer, COPD, Depression, Drug abuse, Early death, Hearing loss, Heart disease, Hyperlipidemia, Hypertension, Kidney disease, Learning disabilities, Mental illness, Mental retardation, Miscarriages / Stillbirths, Stroke, or Vision loss. Social History:  reports that she has never smoked. She has never used smokeless tobacco. She reports that she does not drink alcohol or use illicit drugs.   Prenatal Transfer Tool  Maternal Diabetes: No Genetic Screening: Normal Maternal Ultrasounds/Referrals: Normal Fetal Ultrasounds or other Referrals:  None Maternal Substance Abuse:  No Significant Maternal Medications:  None Significant Maternal Lab Results:  None Other Comments:  None  Review of Systems  All other systems reviewed and are negative.    Dilation: 1 Effacement (%): 60 Station: -2 Exam by:: A.Davis,RN Blood pressure 148/95, pulse 77, temperature 98.4 F (36.9 C), temperature source Oral, resp. rate 18, height 5\' 7"  (1.702 m), weight 163 lb (73.936 kg), last menstrual period 04/02/2012. Maternal Exam:  Abdomen: Patient reports no abdominal tenderness. Fetal presentation: vertex  Pelvis: adequate for delivery.   Cervix: Cervix evaluated by digital exam.     Physical Exam  Constitutional: She is oriented to person,  place, and time. She appears well-developed and well-nourished.  HENT:  Head: Normocephalic and atraumatic.  Eyes: Conjunctivae are normal. Pupils are equal, round, and reactive to light.  Neck: Normal range of motion. Neck supple.  Cardiovascular: Normal rate and regular rhythm.   Respiratory: Effort normal.  GI: Soft.  Genitourinary: Vagina normal and uterus normal.  Musculoskeletal: Normal range of motion.  Neurological: She is alert and oriented to person, place, and time.  Skin: Skin is warm and dry.  Psychiatric: She has a normal mood and affect. Her behavior is normal. Judgment and thought content normal.    Prenatal labs: ABO, Rh: O/POS/-- (07/21 1327) Antibody: NEG (07/21 1327) Rubella: 2.31 (07/21 1327) RPR: NON REAC (09/11 1128)  HBsAg: NEGATIVE (07/21 1327)  HIV: NON REACTIVE (09/11 1128)  GBS: NEGATIVE (11/26 1720)   Assessment/Plan: 37.1 weeks.  PIH.  2 stage IOL.   HARPER,CHARLES A 12/18/2012, 11:10 PM

## 2012-12-18 NOTE — Progress Notes (Signed)
Dr.Harper called, notified of pt's elevated bps, orders received for Magnesium Sulfate 4 gram bolus by 2 gram/hr, Labetalol 20mg  IV PRN, PIH labs and Ambien 5 mg PO for rest

## 2012-12-19 ENCOUNTER — Encounter (HOSPITAL_COMMUNITY): Payer: Self-pay

## 2012-12-19 LAB — MRSA PCR SCREENING: MRSA by PCR: NEGATIVE

## 2012-12-19 LAB — RPR: RPR Ser Ql: NONREACTIVE

## 2012-12-19 MED ORDER — NALOXONE HCL 0.4 MG/ML IJ SOLN
INTRAMUSCULAR | Status: AC
  Filled 2012-12-19: qty 1

## 2012-12-19 MED ORDER — MISOPROSTOL 200 MCG PO TABS
800.0000 ug | ORAL_TABLET | Freq: Once | ORAL | Status: AC
  Administered 2012-12-19: 800 ug via RECTAL
  Filled 2012-12-19: qty 4

## 2012-12-19 MED ORDER — BENZOCAINE-MENTHOL 20-0.5 % EX AERO
1.0000 "application " | INHALATION_SPRAY | CUTANEOUS | Status: DC | PRN
  Administered 2012-12-19 – 2012-12-20 (×2): 1 via TOPICAL
  Filled 2012-12-19 (×2): qty 56

## 2012-12-19 MED ORDER — DIPHENHYDRAMINE HCL 25 MG PO CAPS: 25.0000 mg | ORAL_CAPSULE | Freq: Four times a day (QID) | ORAL | Status: DC | PRN

## 2012-12-19 MED ORDER — LACTATED RINGERS IV SOLN
INTRAVENOUS | Status: DC
  Administered 2012-12-19 – 2012-12-20 (×2): via INTRAVENOUS

## 2012-12-19 MED ORDER — ONDANSETRON HCL 4 MG/2ML IJ SOLN: 4.0000 mg | INTRAMUSCULAR | Status: DC | PRN

## 2012-12-19 MED ORDER — LANOLIN HYDROUS EX OINT: TOPICAL_OINTMENT | CUTANEOUS | Status: DC | PRN

## 2012-12-19 MED ORDER — LABETALOL HCL 5 MG/ML IV SOLN
20.0000 mg | Freq: Once | INTRAVENOUS | Status: AC
  Administered 2012-12-19: 20 mg via INTRAVENOUS
  Filled 2012-12-19: qty 4

## 2012-12-19 MED ORDER — LABETALOL HCL 5 MG/ML IV SOLN: 40.0000 mg | Freq: Once | INTRAVENOUS | Status: DC

## 2012-12-19 MED ORDER — PRENATAL MULTIVITAMIN CH
1.0000 | ORAL_TABLET | Freq: Every day | ORAL | Status: DC
  Administered 2012-12-19 – 2012-12-21 (×3): 1 via ORAL
  Filled 2012-12-19 (×3): qty 1

## 2012-12-19 MED ORDER — ONDANSETRON HCL 4 MG PO TABS: 4.0000 mg | ORAL_TABLET | ORAL | Status: DC | PRN

## 2012-12-19 MED ORDER — SENNOSIDES-DOCUSATE SODIUM 8.6-50 MG PO TABS
2.0000 | ORAL_TABLET | ORAL | Status: DC
  Administered 2012-12-19 – 2012-12-21 (×2): 2 via ORAL
  Filled 2012-12-19: qty 2

## 2012-12-19 MED ORDER — OXYTOCIN 40 UNITS IN LACTATED RINGERS INFUSION - SIMPLE MED: 62.5000 mL/h | INTRAVENOUS | Status: DC | PRN

## 2012-12-19 MED ORDER — ZOLPIDEM TARTRATE 5 MG PO TABS: 5.0000 mg | ORAL_TABLET | Freq: Every evening | ORAL | Status: DC | PRN

## 2012-12-19 MED ORDER — DIBUCAINE 1 % RE OINT: 1.0000 "application " | TOPICAL_OINTMENT | RECTAL | Status: DC | PRN

## 2012-12-19 MED ORDER — WITCH HAZEL-GLYCERIN EX PADS: 1.0000 "application " | MEDICATED_PAD | CUTANEOUS | Status: DC | PRN

## 2012-12-19 MED ORDER — OXYTOCIN 40 UNITS IN LACTATED RINGERS INFUSION - SIMPLE MED
1.0000 m[IU]/min | INTRAVENOUS | Status: DC
  Administered 2012-12-19: 2 m[IU]/min via INTRAVENOUS

## 2012-12-19 MED ORDER — OXYCODONE-ACETAMINOPHEN 5-325 MG PO TABS
1.0000 | ORAL_TABLET | ORAL | Status: DC | PRN
  Administered 2012-12-19: 1 via ORAL
  Filled 2012-12-19: qty 1

## 2012-12-19 MED ORDER — LABETALOL HCL 5 MG/ML IV SOLN: 20.0000 mg | INTRAVENOUS | Status: DC | PRN

## 2012-12-19 MED ORDER — TERBUTALINE SULFATE 1 MG/ML IJ SOLN: 0.2500 mg | Freq: Once | INTRAMUSCULAR | Status: DC | PRN

## 2012-12-19 MED ORDER — IBUPROFEN 600 MG PO TABS
600.0000 mg | ORAL_TABLET | Freq: Four times a day (QID) | ORAL | Status: DC
  Administered 2012-12-19 – 2012-12-21 (×7): 600 mg via ORAL
  Filled 2012-12-19 (×9): qty 1

## 2012-12-19 MED ORDER — TETANUS-DIPHTH-ACELL PERTUSSIS 5-2.5-18.5 LF-MCG/0.5 IM SUSP
0.5000 mL | Freq: Once | INTRAMUSCULAR | Status: DC
  Filled 2012-12-19: qty 0.5

## 2012-12-19 MED ORDER — PROMETHAZINE HCL 25 MG/ML IJ SOLN
12.5000 mg | Freq: Four times a day (QID) | INTRAMUSCULAR | Status: DC | PRN
  Administered 2012-12-19: 12.5 mg via INTRAVENOUS
  Filled 2012-12-19: qty 1

## 2012-12-19 MED ORDER — LABETALOL HCL 200 MG PO TABS
200.0000 mg | ORAL_TABLET | Freq: Three times a day (TID) | ORAL | Status: DC
  Administered 2012-12-19 – 2012-12-21 (×8): 200 mg via ORAL
  Filled 2012-12-19 (×11): qty 1

## 2012-12-19 MED ORDER — BUTORPHANOL TARTRATE 1 MG/ML IJ SOLN
2.0000 mg | Freq: Once | INTRAMUSCULAR | Status: AC
  Administered 2012-12-19: 2 mg via INTRAVENOUS
  Filled 2012-12-19: qty 2

## 2012-12-19 MED ORDER — SIMETHICONE 80 MG PO CHEW: 80.0000 mg | CHEWABLE_TABLET | ORAL | Status: DC | PRN

## 2012-12-19 NOTE — Lactation Note (Signed)
This note was copied from the chart of Jordan Sophy Mesler. Lactation Consultation Note    Initial consult with this mom and baby, now 11 hours post partum. The baby is 37 2/[redacted] weeks gestation, and weighs under 5 pounds. I assisted mom with latching, and her latched deeply with strong suckles and visible swallows, for 10 minutes. Baby then skin to skin with mom. Breast feeding teaching from the baby and Me book done with mom. I gave mom a hand pump and instructed mom in it's use. Mom knows to call for questions/concerns. Lactation services reviewed with mom.  Patient Name: Jordan Holt [id number] Date: 12/19/2012 Reason for consult: Initial assessment;Infant < 6lbs   Maternal Data Formula Feeding for Exclusion: Yes Reason for exclusion: Admission to Intensive Care Unit (ICU) post-partum Infant to breast within first hour of birth: Yes Does the patient have breastfeeding experience prior to this delivery?: No  Feeding Feeding Type: Breast Fed Length of feed: 10 min  LATCH Score/Interventions Latch: Repeated attempts needed to sustain latch, nipple held in mouth throughout feeding, stimulation needed to elicit sucking reflex. Intervention(s): Adjust position;Assist with latch;Breast massage;Breast compression  Audible Swallowing: A few with stimulation Intervention(s): Skin to skin;Hand expression  Type of Nipple: Everted at rest and after stimulation  Comfort (Breast/Nipple): Soft / non-tender     Hold (Positioning): Assistance needed to correctly position infant at breast and maintain latch. Intervention(s): Breastfeeding basics reviewed;Support Pillows;Position options;Skin to skin  LATCH Score: 7  Lactation Tools Discussed/Used     Consult Status Consult Status: Follow-up Date: 12/20/12 Follow-up type: In-patient    Alfred Levins 12/19/2012, 1:48 PM

## 2012-12-19 NOTE — Progress Notes (Signed)
UR chart review completed.  

## 2012-12-20 ENCOUNTER — Encounter: Payer: Medicaid Other | Admitting: Obstetrics & Gynecology

## 2012-12-20 LAB — CBC
Hemoglobin: 8.7 g/dL — ABNORMAL LOW (ref 12.0–15.0)
MCHC: 31.8 g/dL (ref 30.0–36.0)
MCV: 70.3 fL — ABNORMAL LOW (ref 78.0–100.0)
RBC: 3.9 MIL/uL (ref 3.87–5.11)
RDW: 24.6 % — ABNORMAL HIGH (ref 11.5–15.5)

## 2012-12-20 MED ORDER — SODIUM CHLORIDE 0.9 % IJ SOLN
3.0000 mL | Freq: Two times a day (BID) | INTRAMUSCULAR | Status: DC
  Administered 2012-12-20 (×2): 3 mL via INTRAVENOUS

## 2012-12-20 MED ORDER — SODIUM CHLORIDE 0.9 % IJ SOLN: 3.0000 mL | INTRAMUSCULAR | Status: DC | PRN

## 2012-12-20 NOTE — Progress Notes (Signed)
Pt transferring ambulatory to [facility] Rm #[id number].

## 2012-12-20 NOTE — Lactation Note (Addendum)
This note was copied from the chart of Jordan Holt. Lactation Consultation Note    Follow up consult with this mom of a SGA baby, less than 5 pounds and 37 3/7 weeks corrected gestation, and 31 hours post partum. The mom reports the baby would not suck at her breast.  Mom had been pumping, but not expressing.   set he up with the premie setting, and did hand expression prior to pumping, and she expressed about 3 mls of colostrum. Mom now sees she does have milk for the baby. I will try and latch him for his next feed, at around 1130 am, and if he won't latch, will supplement with EBM, and have mom pump every 3 hours, with hand expression to follow.      1130   Follow up with this mom and baby. I was able to assist mom with  a latch, and the baby suckled well for 5 minutes, while I fed 3 mls of colostrum at the breast, with curved tip syringe.   He then unlatched, but relatched again, and fed well for 15 minutes, according to mom.  I noted that the baby has a space in the middle of his upper gum, and a lip frenulum that extends to the gum line. He also has what may be a short frenulum, which is close to the front of his tongue. He has good tongue mobility, but I did some finger suck training with him, and my finger was not pulled into his mouth at all, and his tongue was humping in the back of his mouth. This may be due to a tight frenulum, or not.  Mom encouraged to pump every 3 hours, after breast feeding, and to bottle feed what ever she expressed as supplement. I also called the  [facility] lactation consult, [name], and gave her report on this mom and baby, so she could follow up with then for the 3 pm feeding.  Patient Name: Jordan Holt [id number] Date: 12/20/2012 Reason for consult: Follow-up assessment;Infant < 6lbs   Maternal Data    Feeding Feeding Type: Bottle Fed - Formula (mom refused to breast fed, pumped but got no colostrum) Nipple Type: Slow - flow  LATCH  Score/Interventions                      Lactation Tools Discussed/Used Pump Review: Setup, frequency, and cleaning;Milk Storage;Other (comment) (premie setting and hand expression shown to mom with good results) Initiated by:: Tana Coast, RN to protect moms milk supply and to provide EBm for the baby Date initiated:: 12/19/12   Consult Status Consult Status: Follow-up Date: 12/21/12 Follow-up type: In-patient    Alfred Levins 12/20/2012, 10:12 AM

## 2012-12-20 NOTE — Progress Notes (Signed)
Post Partum Day 1 Subjective: no complaints  Objective: Blood pressure 123/82, pulse 99, temperature 98.4 F (36.9 C), temperature source Oral, resp. rate 18, height 5\' 7"  (1.702 m), weight 160 lb 12.8 oz (72.938 kg), last menstrual period 04/02/2012, SpO2 99.00%, unknown if currently breastfeeding.  Physical Exam:  General: alert and no distress Lochia: appropriate Uterine Fundus: firm Incision: healing well DVT Evaluation: No evidence of DVT seen on physical exam.   Recent Labs  12/18/12 2035 12/20/12 0503  HGB 10.8* 8.7*  HCT 32.4* 27.4*    Assessment/Plan: Plan for discharge tomorrow.  Anemia.  Clinically stable.    LOS: 2 days   HARPER,CHARLES A 12/20/2012, 5:44 AM

## 2012-12-20 NOTE — Progress Notes (Addendum)
[facility] charge RN Toni Amend notified of pt off magnesium to transfer about noon - will call back with bed assignment

## 2012-12-20 NOTE — Progress Notes (Signed)
Awaiting return call from [facility] RN to give transfer report.

## 2012-12-20 NOTE — Progress Notes (Signed)
Called [facility] for bed assignment

## 2012-12-20 NOTE — Lactation Note (Signed)
This note was copied from the chart of Jordan Yevonne Yokum. Lactation Consultation Note Mom request LC; baby showing hunger cues. Oral assessment : baby has a gap in the front upper gum line, a small frenulum with very good tongue movement, a high palate and a weak suction. Baby is thrusting tongue and does not draw finger in during suck assessment.  Discussed options with mom, and reinforced the importance of getting calories in baby. Suggested initiating a nipple shield, mom accepts. Baby latched to the nipple shield, but suck is not strong enough to get milk transfer. Nipple shield dry after baby detached. With [employer], a written feeding plan was provided.  Plan: Feed baby using volume parameters provided. Latch to the breast using nipple shield. After feeding, pump for 15 minutes and hand express. Feed pumped breast milk to baby using 5Fr feeding tube and syringe. If not enough breast milk, supplement with formula.  Continue frequent STS and cue based feeding, and feed baby at least 8 times per day and on cue.  When baby's latch and suck improve, an SNS might be appropriate. Discussed with mom that the feeding plan may change as baby grows and as her milk comes in.  At this time, mom was able to pump/ hand express 2 mL which was fed to baby using finger, and an additional 8 mL formula was also fed to baby using finger and 5Fr feeding tube. Mom's husband is not present at this time, mom has a friend with her who was very helpful. Mom states she will be able to discuss the feeding plan with dad when he arrives.  Enc mom that she can call for assistance any time she needs.  Patient Name: Jordan Holt [id number] Date: 12/20/2012 Reason for consult: Follow-up assessment;Infant < 6lbs   Maternal Data    Feeding Feeding Type: Breast Fed Length of feed: 10 min  LATCH Score/Interventions Latch: Repeated attempts needed to sustain latch, nipple held in mouth throughout feeding,  stimulation needed to elicit sucking reflex. Intervention(s): Assist with latch  Audible Swallowing: None Intervention(s): Skin to skin;Hand expression Intervention(s): Skin to skin;Hand expression  Type of Nipple: Everted at rest and after stimulation  Comfort (Breast/Nipple): Soft / non-tender     Hold (Positioning): Assistance needed to correctly position infant at breast and maintain latch. Intervention(s): Breastfeeding basics reviewed;Support Pillows;Position options;Skin to skin  LATCH Score: 6  Lactation Tools Discussed/Used Tools: Nipple Shields Nipple shield size: 16 Pump Review: Setup, frequency, and cleaning;Milk Storage Initiated by:: c Nurse, children's LC   Consult Status Consult Status: Follow-up Date: 12/21/12 Follow-up type: In-patient    Jordan Holt [facility] 12/20/2012, 3:05 PM

## 2012-12-21 MED ORDER — IBUPROFEN 600 MG PO TABS: 600.0000 mg | ORAL_TABLET | Freq: Four times a day (QID) | ORAL | Status: DC | PRN

## 2012-12-21 MED ORDER — LABETALOL HCL 200 MG PO TABS: 200.0000 mg | ORAL_TABLET | Freq: Three times a day (TID) | ORAL | Status: DC

## 2012-12-21 MED ORDER — OXYCODONE-ACETAMINOPHEN 5-325 MG PO TABS: 1.0000 | ORAL_TABLET | ORAL | Status: DC | PRN

## 2012-12-21 NOTE — Progress Notes (Addendum)
Post Partum Day 2 Subjective: no complaints  Objective: Blood pressure 136/82, pulse 91, temperature 98.1 F (36.7 C), temperature source Oral, resp. rate 16, height 5\' 7"  (1.702 m), weight 159 lb (72.122 kg), last menstrual period 04/02/2012, SpO2 96.00%, unknown if currently breastfeeding.  Physical Exam:  General: alert and no distress Lochia: appropriate Uterine Fundus: firm Incision: healing well DVT Evaluation: No evidence of DVT seen on physical exam.   Recent Labs  12/18/12 2035 12/20/12 0503  HGB 10.8* 8.7*  HCT 32.4* 27.4*    Assessment/Plan: Discharge home.  Anemia.  Clinically stable.  Iron Rx.   LOS: 3 days   Jordan Holt A 12/21/2012, 7:57 AM

## 2012-12-21 NOTE — Lactation Note (Signed)
This note was copied from the chart of Boy Johnni Wunschel. Lactation Consultation Note    Follow up consult with this mom of a now 37 4/[redacted] week gestation, SGA baby, weighing 4 1/2 pounds. Mom breast fed, and I did a pre and post weight, and he transferred 25 mls. i advised mom to pump every 3 hours, 8 times a day, to protect her supply and provide EBm for the baby. Mom pumped 40 mls after breast feeding, a great amount. I left her 10 mls at the bedside, to offer the baby by bottle, after her next breast feeds.  I also faxed mom's information to [facility], since she is not active with [facility] yet, and will need a DEP. She has the paper work filled out for a [employer] DEP, and dad is going to bring in the $30 tomorrow. Mom knows to call for questions/concerns.  Patient Name: Boy Betha Shadix [id number] Date: 12/21/2012 Reason for consult: Follow-up assessment;Infant < 6lbs   Maternal Data    Feeding Feeding Type: Breast Fed  LATCH Score/Interventions Latch: Grasps breast easily, tongue down, lips flanged, rhythmical sucking. Intervention(s): Assist with latch  Audible Swallowing: Spontaneous and intermittent  Type of Nipple: Everted at rest and after stimulation  Comfort (Breast/Nipple): Filling, red/small blisters or bruises, mild/mod discomfort  Problem noted: Filling  Hold (Positioning): Assistance needed to correctly position infant at breast and maintain latch. Intervention(s): Breastfeeding basics reviewed;Support Pillows;Position options  LATCH Score: 8  Lactation Tools Discussed/Used Pump Review: Setup, frequency, and cleaning (mom advised to pump every 3 hours after breast feeding , to protct milk supply and porvide EBm for baby)   Consult Status Consult Status: Follow-up Date: 12/22/12 Follow-up type: In-patient    Alfred Levins 12/21/2012, 2:08 PM

## 2012-12-21 NOTE — Discharge Summary (Signed)
Obstetric Discharge Summary Reason for Admission: induction of labor Prenatal Procedures: ultrasound Intrapartum Procedures: spontaneous vaginal delivery Postpartum Procedures: magnesium sulfate Complications-Operative and Postpartum: none Hemoglobin  Date Value Range Status  12/20/2012 8.7* 12.0 - 15.0 g/dL Final     DELTA CHECK NOTED     REPEATED TO VERIFY     HCT  Date Value Range Status  12/20/2012 27.4* 36.0 - 46.0 % Final    Physical Exam:  General: alert and no distress Lochia: appropriate Uterine Fundus: firm Incision: healing well DVT Evaluation: No evidence of DVT seen on physical exam.  Discharge Diagnoses: Term Pregnancy-delivered.  PIH  Discharge Information: Date: 12/21/2012 Activity: pelvic rest Diet: routine Medications: PNV, Ibuprofen, Colace, Iron, Percocet and Labetalol Condition: stable Instructions: refer to practice specific booklet Discharge to: home Follow-up Information   Follow up with Antionette Char A, MD. Schedule an appointment as soon as possible for a visit in 2 weeks.   Specialty:  Obstetrics and Gynecology   Contact information:   [address] [place] [address] [phone]       Newborn Data: Live born female  Birth Weight: 4 lb 11.7 oz (2145 g) APGAR: 8, 9  Home with mother.  Cedar Ditullio A 12/21/2012, 8:02 AM

## 2012-12-22 ENCOUNTER — Ambulatory Visit: Payer: Self-pay

## 2012-12-22 ENCOUNTER — Encounter (HOSPITAL_COMMUNITY)
Admission: RE | Admit: 2012-12-22 | Discharge: 2012-12-22 | Disposition: A | Discharge status: Home / Self Care | Payer: Medicaid Other | Source: Ambulatory Visit | Attending: Obstetrics | Admitting: Obstetrics

## 2012-12-22 DIAGNOSIS — O923 Agalactia: Secondary | ICD-10-CM | POA: Insufficient documentation

## 2012-12-22 NOTE — Lactation Note (Signed)
This note was copied from the chart of Boy Samreen Seltzer. Lactation Consultation Note  Patient Name: Boy Akeyla Molden [id number] Date: 12/22/2012 Reason for consult: Follow-up assessment Milk is in both breast - baby recently fed on the left breast, right boarderline full to firm areas lateral aspects. Encouraged mom to pump the right breast prior to [facility] . Mom and dad had multiply questions, LC reviewed  Basics , sore nipple  and engorgement prevention and tx. Per mom nipples alittle tender , given comfort  gels with Instructions. Mom rented a DEBP , plans to apply for [facility] on Monday, showed the DEBP and reviewed set up .  And reminded mom to take all pump pieces home. Referring the Baby and me booklet. F/U appointment 12/28/2012 Thursday 0900 am with lactation , mom aware and apt. Card given with extra feeding diary sheets.    Maternal Data    Feeding Feeding Type: Breast Fed Length of feed: 5 min  LATCH Score/Interventions                Intervention(s): Breastfeeding basics reviewed     Lactation Tools Discussed/Used     Consult Status      Kathrin Greathouse 12/22/2012, 10:50 AM

## 2012-12-31 ENCOUNTER — Encounter: Payer: Self-pay | Admitting: Obstetrics & Gynecology

## 2012-12-31 ENCOUNTER — Ambulatory Visit (INDEPENDENT_AMBULATORY_CARE_PROVIDER_SITE_OTHER): Payer: Medicaid Other | Admitting: Obstetrics & Gynecology

## 2012-12-31 NOTE — Progress Notes (Signed)
  Subjective:     Jordan Holt is a 27 y.o. female who presents for a postpartum visit. She is 2 weeks postpartum following a spontaneous vaginal delivery. I have fully reviewed the prenatal and intrapartum course. The delivery was at 37.2 gestational weeks. Outcome: spontaneous vaginal delivery. Anesthesia: none. Postpartum course has been WNL. Baby's course has been WNL. Baby is feeding by breast. Bleeding staining only. Bowel function is normal. Bladder function is normal. Patient is not sexually active. Contraception method is abstinence. Postpartum depression screening: negative.  The following portions of the patient's history were reviewed and updated as appropriate: allergies, current medications, past family history, past medical history, past social history, past surgical history and problem list.  Review of Systems Pertinent items are noted in HPI.   Objective:    BP 141/94  Pulse 93  Temp(Src) 98.7 F (37.1 C)  Ht 5\' 7"  (1.702 m)  Wt 147 lb (66.679 kg)  BMI 23.02 kg/m2  LMP 04/02/2012  Breastfeeding? Yes        Assessment:   Persistent B/P elevation  Plan:   Continue po Labetalol. Follow up in: 2 weeks or as needed.

## 2013-01-14 ENCOUNTER — Encounter: Payer: Medicaid Other | Admitting: Obstetrics & Gynecology

## 2013-01-14 ENCOUNTER — Encounter (HOSPITAL_COMMUNITY): Payer: Self-pay | Admitting: Emergency Medicine

## 2013-01-14 ENCOUNTER — Emergency Department (HOSPITAL_COMMUNITY)
Admission: EM | Admit: 2013-01-14 | Discharge: 2013-01-14 | Disposition: A | Discharge status: Home / Self Care | Payer: Medicaid Other | Source: Home / Self Care | Attending: Family Medicine | Admitting: Family Medicine

## 2013-01-14 DIAGNOSIS — IMO0002 Reserved for concepts with insufficient information to code with codable children: Secondary | ICD-10-CM

## 2013-01-14 DIAGNOSIS — L02414 Cutaneous abscess of left upper limb: Secondary | ICD-10-CM

## 2013-01-14 MED ORDER — MUPIROCIN CALCIUM 2 % EX CREA: 1.0000 "application " | TOPICAL_CREAM | Freq: Two times a day (BID) | CUTANEOUS | Status: DC

## 2013-01-14 NOTE — ED Notes (Signed)
Bacitracin and bandaid to wound

## 2013-01-14 NOTE — ED Notes (Signed)
Using ichtammel OTC oint to bring the boil to a head , then triple antibiotic oint

## 2013-01-14 NOTE — Discharge Instructions (Signed)
Warm compress twice a day when you apply the antibiotic,  return as needed.

## 2013-01-14 NOTE — ED Provider Notes (Signed)
CSN: [id number]     Arrival date & time 01/14/13  1504 History   First MD Initiated Contact with Patient 01/14/13 1645     Chief Complaint  Patient presents with  . Wound Check   (Consider location/radiation/quality/duration/timing/severity/associated sxs/prior Treatment) Patient is a 28 y.o. female presenting with wound check. The history is provided by the patient and the spouse.  Wound Check This is a new problem. The current episode started more than 1 week ago. The problem has been rapidly improving. Associated symptoms comments: Boil on left forearm , drained, now resolving.. She has tried a warm compress for the symptoms. The treatment provided significant relief.    Past Medical History  Diagnosis Date  . No pertinent past medical history   . PCOS (polycystic ovarian syndrome)    Past Surgical History  Procedure Laterality Date  . No past surgeries     Family History  Problem Relation Age of Onset  . Alcohol abuse Neg Hx   . Arthritis Neg Hx   . Asthma Neg Hx   . Birth defects Neg Hx   . Cancer Neg Hx   . COPD Neg Hx   . Depression Neg Hx   . Drug abuse Neg Hx   . Early death Neg Hx   . Hearing loss Neg Hx   . Heart disease Neg Hx   . Hyperlipidemia Neg Hx   . Hypertension Neg Hx   . Kidney disease Neg Hx   . Learning disabilities Neg Hx   . Mental illness Neg Hx   . Mental retardation Neg Hx   . Miscarriages / Stillbirths Neg Hx   . Stroke Neg Hx   . Vision loss Neg Hx   . Diabetes Mother    History  Substance Use Topics  . Smoking status: Never Smoker   . Smokeless tobacco: Never Used  . Alcohol Use: No   OB History   Grav Para Term Preterm Abortions TAB SAB Ect Mult Living   1 1 1       1      Review of Systems  Constitutional: Negative.   Skin: Positive for wound.    Allergies  Review of patient's allergies indicates no known allergies.  Home Medications   Current Outpatient Rx  Name  Route  Sig  Dispense  Refill  . cholecalciferol  (VITAMIN D) 1000 UNITS tablet   Oral   Take 1,000 Units by mouth daily.         Marland Kitchen ibuprofen (ADVIL,MOTRIN) 600 MG tablet   Oral   Take 1 tablet (600 mg total) by mouth every 6 (six) hours as needed for mild pain or moderate pain.   40 tablet   5   . Iron-FA-B Cmp-C-Biot-Probiotic (FUSION PLUS) CAPS   Oral   Take 1 capsule by mouth daily.   30 capsule   6   . labetalol (NORMODYNE) 200 MG tablet   Oral   Take 1 tablet (200 mg total) by mouth every 8 (eight) hours.   90 tablet   0   . mupirocin cream (BACTROBAN) 2 %   Topical   Apply 1 application topically 2 (two) times daily.   30 g   0   . Prenatal Vit-Fe Fumarate-FA (PRENATAL MULTIVITAMIN) TABS   Oral   Take 1 tablet by mouth daily at 12 noon.          BP 130/69  Pulse 98  Temp(Src) 97.8 F (36.6 C) (Oral)  Resp 16  SpO2 100% Physical Exam  Nursing note and vitals reviewed. Constitutional: She is oriented to person, place, and time. She appears well-developed and well-nourished.  Neurological: She is alert and oriented to person, place, and time.  Skin: Skin is warm and dry.  1cm open dry resolving abscess to left volar forearm. No erythema or drainage.    ED Course  Procedures (including critical care time) Labs Review Labs Reviewed - No data to display Imaging Review No results found.  EKG Interpretation    Date/Time:    Ventricular Rate:    PR Interval:    QRS Duration:   QT Interval:    QTC Calculation:   R Axis:     Text Interpretation:              MDM  Wound care.   Billy Fischer, MD 01/14/13 [phone]

## 2013-01-17 ENCOUNTER — Ambulatory Visit: Payer: Medicaid Other | Admitting: Obstetrics & Gynecology

## 2013-01-21 ENCOUNTER — Ambulatory Visit (INDEPENDENT_AMBULATORY_CARE_PROVIDER_SITE_OTHER): Payer: Medicaid Other | Admitting: Obstetrics & Gynecology

## 2013-01-21 ENCOUNTER — Encounter: Payer: Self-pay | Admitting: Obstetrics & Gynecology

## 2013-01-21 MED ORDER — NORETHINDRONE 0.35 MG PO TABS: 1.0000 | ORAL_TABLET | Freq: Every day | ORAL | Status: DC

## 2013-01-21 NOTE — Progress Notes (Signed)
Subjective:    Pt not seen.

## 2013-01-21 NOTE — Patient Instructions (Addendum)
Obtain varicella titer from the health department Ibuprofen for back pain, follow package instructions. Please contact office if back pain is unresolved.

## 2013-01-22 LAB — PAP IG W/ RFLX HPV ASCU

## 2013-04-22 ENCOUNTER — Ambulatory Visit: Payer: Medicaid Other | Admitting: Obstetrics & Gynecology

## 2013-10-14 ENCOUNTER — Ambulatory Visit: Payer: Medicaid Other | Admitting: Obstetrics & Gynecology

## 2013-11-11 ENCOUNTER — Encounter: Payer: Self-pay | Admitting: Obstetrics & Gynecology

## 2014-01-06 ENCOUNTER — Encounter: Payer: Self-pay | Admitting: *Deleted

## 2014-01-07 ENCOUNTER — Encounter: Payer: Self-pay | Admitting: Obstetrics & Gynecology

## 2014-04-30 ENCOUNTER — Ambulatory Visit: Payer: Medicaid Other | Admitting: Certified Nurse Midwife

## 2014-05-15 ENCOUNTER — Ambulatory Visit: Payer: Medicaid Other | Admitting: Obstetrics

## 2015-01-25 ENCOUNTER — Encounter (HOSPITAL_COMMUNITY): Payer: Self-pay

## 2015-01-25 ENCOUNTER — Inpatient Hospital Stay (HOSPITAL_COMMUNITY)
Admission: AD | Admit: 2015-01-25 | Discharge: 2015-01-25 | Disposition: A | Discharge status: Home / Self Care | Payer: Medicaid Other | Source: Ambulatory Visit | Attending: Obstetrics & Gynecology | Admitting: Obstetrics & Gynecology

## 2015-01-25 DIAGNOSIS — R51 Headache: Secondary | ICD-10-CM | POA: Insufficient documentation

## 2015-01-25 DIAGNOSIS — Z789 Other specified health status: Secondary | ICD-10-CM

## 2015-01-25 DIAGNOSIS — Z3202 Encounter for pregnancy test, result negative: Secondary | ICD-10-CM | POA: Insufficient documentation

## 2015-01-25 DIAGNOSIS — R519 Headache, unspecified: Secondary | ICD-10-CM

## 2015-01-25 LAB — POCT PREGNANCY, URINE: PREG TEST UR: NEGATIVE

## 2015-01-25 LAB — URINALYSIS, ROUTINE W REFLEX MICROSCOPIC
BILIRUBIN URINE: NEGATIVE
Glucose, UA: NEGATIVE mg/dL
HGB URINE DIPSTICK: NEGATIVE
KETONES UR: NEGATIVE mg/dL
Leukocytes, UA: NEGATIVE
Nitrite: NEGATIVE
Protein, ur: NEGATIVE mg/dL
Specific Gravity, Urine: 1.02 (ref 1.005–1.030)
pH: 7 (ref 5.0–8.0)

## 2015-01-25 MED ORDER — ACETAMINOPHEN 325 MG PO TABS
650.0000 mg | ORAL_TABLET | Freq: Once | ORAL | Status: AC
  Administered 2015-01-25: 650 mg via ORAL
  Filled 2015-01-25: qty 2

## 2015-01-25 MED ORDER — ACETAMINOPHEN 325 MG PO TABS: 650.0000 mg | ORAL_TABLET | Freq: Four times a day (QID) | ORAL | Status: DC | PRN

## 2015-01-25 MED ORDER — CONCEPT OB 130-92.4-1 MG PO CAPS: 1.0000 | ORAL_CAPSULE | Freq: Every day | ORAL | Status: DC

## 2015-01-25 NOTE — Discharge Instructions (Signed)

## 2015-01-25 NOTE — MAU Provider Note (Signed)
Chief Complaint: No chief complaint on file.   First Provider Initiated Contact with Patient 01/25/15 1315     SUBJECTIVE HPI: Jordan Holt is a 30 y.o. G44P1001 female who is undergoing infertility treatment at [facility] who presents to Maternity Admissions reporting congestion, headache, subjective fever (felt hot) and concern for pregnancy. Wants to know what's safe to take for these symptoms. Has not missed a menstrual period. Negative home pregnancy test yesterday.  Location: Frontal, sinuses Quality: Pressure Severity: 7/10 on pain scale Duration: Less than 24 hours Context: Congestion, cold symptoms Timing: Constant Modifying factors: Hasn't tried anything for the pain. Specifically has not taken any antipyretics. Associated signs and symptoms: Positive for subjective fever (has not taken temperature), congestion. Negative for chills, nausea, vomiting, sore throat, difficulties with speech or gait.  Past Medical History  Diagnosis Date  . No pertinent past medical history   . PCOS (polycystic ovarian syndrome)    OB History  Gravida Para Term Preterm AB SAB TAB Ectopic Multiple Living  1 1 1       1     # Outcome Date GA Lbr Len/2nd Weight Sex Delivery Anes PTL Lv  1 Term 12/19/12 [redacted]w[redacted]d 00:21 / 00:38 4 lb 11.7 oz (2.145 kg) M Vag-Spont Local  Y     Past Surgical History  Procedure Laterality Date  . No past surgeries     Social History   Social History  . Marital Status: Married    Spouse Name: N/A  . Number of Children: N/A  . Years of Education: N/A   Occupational History  . Not on file.   Social History Main Topics  . Smoking status: Never Smoker   . Smokeless tobacco: Never Used  . Alcohol Use: No  . Drug Use: No  . Sexual Activity: Not Currently    Birth Control/ Protection: None   Other Topics Concern  . Not on file   Social History Narrative   No current facility-administered medications on file prior to encounter.   Current Outpatient  Prescriptions on File Prior to Encounter  Medication Sig Dispense Refill  . cholecalciferol (VITAMIN D) 1000 UNITS tablet Take 1,000 Units by mouth daily.    . Iron-FA-B Cmp-C-Biot-Probiotic (FUSION PLUS) CAPS Take 1 capsule by mouth daily. 30 capsule 6  . labetalol (NORMODYNE) 200 MG tablet Take 1 tablet (200 mg total) by mouth every 8 (eight) hours. 90 tablet 0  . mupirocin cream (BACTROBAN) 2 % Apply 1 application topically 2 (two) times daily. 30 g 0  . norethindrone (MICRONOR,CAMILA,ERRIN) 0.35 MG tablet Take 1 tablet (0.35 mg total) by mouth daily. 1 Package 11  . Prenatal Vit-Fe Fumarate-FA (PRENATAL MULTIVITAMIN) TABS Take 1 tablet by mouth daily at 12 noon.     No Known Allergies  I have reviewed the past Medical Hx, Surgical Hx, Social Hx, Allergies and Medications.   Review of Systems  Constitutional: Positive for fever. Negative for chills. Fatigue: Subjective.  HENT: Positive for congestion, rhinorrhea and sinus pressure. Negative for ear pain and sore throat.   Respiratory: Negative for cough.   Gastrointestinal: Negative for abdominal pain.  Musculoskeletal: Negative for gait problem and neck stiffness.  Neurological: Positive for headaches. Negative for dizziness, syncope, speech difficulty and numbness.    OBJECTIVE Patient Vitals for the past 24 hrs:  BP Temp Temp src Pulse Resp Height Weight  01/25/15 1239 108/69 mmHg 97.8 F (36.6 C) Oral 104 18 5' 6.5" (1.689 m) 138 lb 8 oz (62.823 kg)  Constitutional: Well-developed, well-nourished female in no acute distress.  Head: Nasal congestion and rhinorrhea. Sinuses mildly tender. Grossly normal vision. Cardiovascular: Mild tachycardia Respiratory: normal rate and effort.  Neurologic: Alert and oriented x 4. Normal speech and gait.  LAB RESULTS Results for orders placed or performed during the hospital encounter of 01/25/15 (from the past 24 hour(s))  Urinalysis, Routine w reflex microscopic (not at [facility])     Status:  None   Collection Time: 01/25/15 12:40 PM  Result Value Ref Range   Color, Urine YELLOW YELLOW   APPearance CLEAR CLEAR   Specific Gravity, Urine 1.020 1.005 - 1.030   pH 7.0 5.0 - 8.0   Glucose, UA NEGATIVE NEGATIVE mg/dL   Hgb urine dipstick NEGATIVE NEGATIVE   Bilirubin Urine NEGATIVE NEGATIVE   Ketones, ur NEGATIVE NEGATIVE mg/dL   Protein, ur NEGATIVE NEGATIVE mg/dL   Nitrite NEGATIVE NEGATIVE   Leukocytes, UA NEGATIVE NEGATIVE  Pregnancy, urine POC     Status: None   Collection Time: 01/25/15 12:50 PM  Result Value Ref Range   Preg Test, Ur NEGATIVE NEGATIVE    IMAGING No results found.  MAU COURSE UPT, Tylenol.  Headache resolved.  MDM 30 year old non-pregnant female with cold symptoms, negative pregnancy test. Is undergoing infertility treatment. List of medication safe to take during infertility pregnant in pregnancy given.  ASSESSMENT 1. Acute nonintractable headache, unspecified headache type   2. Attempting to conceive    PLAN Discharge home in stable condition. URI comfort measures. Basic preconception counseling. Patient started on prenatal vitamin. Follow-up Information    Follow up with Obstetrician.   Why:  For routine Ob/Gyn care      Follow up with [place].   Why:  As needed in emergencies   Contact information:   [id number] [place] [phone]        Medication List    STOP taking these medications        FUSION PLUS Caps     labetalol 200 MG tablet  Commonly known as:  NORMODYNE     mupirocin cream 2 %  Commonly known as:  BACTROBAN     norethindrone 0.35 MG tablet  Commonly known as:  MICRONOR,CAMILA,ERRIN      TAKE these medications        acetaminophen 325 MG tablet  Commonly known as:  TYLENOL  Take 2 tablets (650 mg total) by mouth every 6 (six) hours as needed for fever or headache.     CONCEPT OB 130-92.4-1 MG Caps   Take 1 tablet by mouth daily.       [place], CNM 01/25/2015  1:19 PM

## 2015-01-25 NOTE — MAU Note (Signed)
Patient not feeling well wants to know if pregnant, 12/30/14, fertility treatment at [facility], has been headache, no nausea, negative home pregnancy test yesterday, headache 7/10

## 2015-02-23 ENCOUNTER — Inpatient Hospital Stay (HOSPITAL_COMMUNITY)
Admission: AD | Admit: 2015-02-23 | Discharge: 2015-02-23 | Disposition: A | Discharge status: Home / Self Care | Payer: Medicaid Other | Source: Ambulatory Visit | Attending: Family Medicine | Admitting: Family Medicine

## 2015-02-23 DIAGNOSIS — Z3201 Encounter for pregnancy test, result positive: Secondary | ICD-10-CM | POA: Diagnosis present

## 2015-02-23 NOTE — MAU Note (Signed)
PT  SAYS LMP  WAS 1-23   .  SHE  IS INFERTILITY    PT -    GAME MEDS  TO RELEASE  EGGS.-    [facility]-    TOLD  TO  DO PREG  TEST   2 WEEKS  LATER -  SO TONIGHT    POSITIVE   -   SO SHE  WANTS  TEST  CONFIRMED.

## 2015-02-23 NOTE — MAU Provider Note (Signed)
Jordan Holt is a 30 y.o. G1P1001 who presents today for pregnancy confirmation. She denies any abdominal pain or vaginal bleeding. She states that she had a +HPT.   Patient advised that we do not provide pregnancy confirmation here. She was told to FU in the clinic B/W 8-5 tomorrow for pregnancy confirmation.

## 2015-03-15 ENCOUNTER — Encounter (HOSPITAL_COMMUNITY): Payer: Self-pay | Admitting: Medical

## 2015-03-15 ENCOUNTER — Inpatient Hospital Stay (HOSPITAL_COMMUNITY)
Admission: AD | Admit: 2015-03-15 | Discharge: 2015-03-15 | Disposition: A | Discharge status: Home / Self Care | Payer: Medicaid Other | Source: Ambulatory Visit | Attending: Obstetrics | Admitting: Obstetrics

## 2015-03-15 DIAGNOSIS — O219 Vomiting of pregnancy, unspecified: Secondary | ICD-10-CM | POA: Diagnosis not present

## 2015-03-15 DIAGNOSIS — O211 Hyperemesis gravidarum with metabolic disturbance: Secondary | ICD-10-CM | POA: Diagnosis not present

## 2015-03-15 DIAGNOSIS — Z3A01 Less than 8 weeks gestation of pregnancy: Secondary | ICD-10-CM | POA: Diagnosis not present

## 2015-03-15 LAB — URINALYSIS, ROUTINE W REFLEX MICROSCOPIC
Bilirubin Urine: NEGATIVE
GLUCOSE, UA: NEGATIVE mg/dL
Hgb urine dipstick: NEGATIVE
KETONES UR: 15 mg/dL — AB
LEUKOCYTES UA: NEGATIVE
Nitrite: NEGATIVE
Protein, ur: NEGATIVE mg/dL
Specific Gravity, Urine: 1.015 (ref 1.005–1.030)
pH: 7 (ref 5.0–8.0)

## 2015-03-15 LAB — POCT PREGNANCY, URINE: Preg Test, Ur: POSITIVE — AB

## 2015-03-15 MED ORDER — LACTATED RINGERS IV SOLN
25.0000 mg | Freq: Once | INTRAVENOUS | Status: AC
  Administered 2015-03-15: 25 mg via INTRAVENOUS
  Filled 2015-03-15: qty 1

## 2015-03-15 MED ORDER — PROMETHAZINE HCL 12.5 MG PO TABS: 12.5000 mg | ORAL_TABLET | Freq: Four times a day (QID) | ORAL | Status: DC | PRN

## 2015-03-15 NOTE — Discharge Instructions (Signed)
Morning Sickness Morning sickness is when you feel sick to your stomach (nauseous) during pregnancy. You may feel sick to your stomach and throw up (vomit). You may feel sick in the morning, but you can feel this way any time of day. Some women feel very sick to their stomach and cannot stop throwing up (hyperemesis gravidarum). HOME CARE  Only take medicines as told by your doctor.  Take multivitamins as told by your doctor. Taking multivitamins before getting pregnant can stop or lessen the harshness of morning sickness.  Eat dry toast or unsalted crackers before getting out of bed.  Eat 5 to 6 small meals a day.  Eat dry and bland foods like rice and baked potatoes.  Do not drink liquids with meals. Drink between meals.  Do not eat greasy, fatty, or spicy foods.  Have someone cook for you if the smell of food causes you to feel sick or throw up.  If you feel sick to your stomach after taking prenatal vitamins, take them at night or with a snack.  Eat protein when you need a snack (nuts, yogurt, cheese).  Eat unsweetened gelatins for dessert.  Wear a bracelet used for sea sickness (acupressure wristband).  Go to a doctor that puts thin needles into certain body points (acupuncture) to improve how you feel.  Do not smoke.  Use a humidifier to keep the air in your house free of odors.  Get lots of fresh air. GET HELP IF:  You need medicine to feel better.  You feel dizzy or lightheaded.  You are losing weight. GET HELP RIGHT AWAY IF:   You feel very sick to your stomach and cannot stop throwing up.  You pass out (faint). MAKE SURE YOU:  Understand these instructions.  Will watch your condition.  Will get help right away if you are not doing well or get worse.   This information is not intended to replace advice given to you by your health care provider. Make sure you discuss any questions you have with your health care provider.   Document Released: 02/04/2004  Document Revised: 01/17/2014 Document Reviewed: 06/13/2012 Elsevier Interactive Patient Education 2016 [place] for nausea and vomiting in pregnancy Severe cases of hyperemesis gravidarum can lead to dehydration and malnutrition. The hyperemesis eating plan is one way to lessen the symptoms of nausea and vomiting. It is often used with prescribed medicines to control your symptoms.  WHAT CAN I DO TO RELIEVE MY SYMPTOMS? Listen to your body. Everyone is different and has different preferences. Find what works best for you. Some of the following things may help:  Eat and drink slowly.  Eat 5-6 small meals daily instead of 3 large meals.   Eat crackers before you get out of bed in the morning.   Starchy foods are usually well tolerated (such as cereal, toast, bread, potatoes, pasta, rice, and pretzels).   Ginger may help with nausea. Add  tsp ground ginger to hot tea or choose ginger tea.   Try drinking 100% fruit juice or an electrolyte drink.  Continue to take your prenatal vitamins as directed by your health care provider. If you are having trouble taking your prenatal vitamins, talk with your health care provider about different options.  Include at least 1 serving of protein with your meals and snacks (such as meats or poultry, beans, nuts, eggs, or yogurt). Try eating a protein-rich snack before bed (such as cheese and crackers or a half [place] or  peanut butter sandwich). WHAT THINGS SHOULD I AVOID TO REDUCE MY SYMPTOMS? The following things may help reduce your symptoms:  Avoid foods with strong smells. Try eating meals in well-ventilated areas that are free of odors.  Avoid drinking water or other beverages with meals. Try not to drink anything less than 30 minutes before and after meals.  Avoid drinking more than 1 cup of fluid at a time.  Avoid fried or high-fat foods, such as butter and cream sauces.  Avoid spicy foods.  Avoid skipping meals the best  you can. Nausea can be more intense on an empty stomach. If you cannot tolerate food at that time, do not force it. Try sucking on ice chips or other frozen items and make up the calories later.  Avoid lying down within 2 hours after eating.   This information is not intended to replace advice given to you by your health care provider. Make sure you discuss any questions you have with your health care provider.   Document Released: 10/24/2006 Document Revised: 01/01/2013 Document Reviewed: 10/31/2012 Elsevier Interactive Patient Education [employer].

## 2015-03-15 NOTE — MAU Note (Signed)
Pt states she has been vomiting.  Pt states it started about a week ago.  Pt states she had the positive pregnancy test at [facility].

## 2015-03-15 NOTE — MAU Provider Note (Signed)
History     CSN: [id number]  Arrival date and time: 03/15/15 1258   First Provider Initiated Contact with Patient 03/15/15 1322      Chief Complaint  Patient presents with  . Emesis During Pregnancy   HPI Ms. Aniylah Holt is a 30 y.o. G2P1001 at [redacted]w[redacted]d who presents to MAU today with complaint of N/V since Tuesday. The patient conceived using fertility treatments with Clomid through [facility]. She states that she may have a twin pregnancy. This will be confirmed by [place] in the office next week prior to her returning to Dr. Jodi Mourning for prenatal care. She states that vomiting is most days, but not constant. She denies diarrhea, abdominal pain, UTI symptoms, vaginal bleeding or fever.   OB History    Gravida Para Term Preterm AB TAB SAB Ectopic Multiple Living   2 1 1       1       Past Medical History  Diagnosis Date  . No pertinent past medical history   . PCOS (polycystic ovarian syndrome)     Past Surgical History  Procedure Laterality Date  . No past surgeries      Family History  Problem Relation Age of Onset  . Alcohol abuse Neg Hx   . Arthritis Neg Hx   . Asthma Neg Hx   . Birth defects Neg Hx   . Cancer Neg Hx   . COPD Neg Hx   . Depression Neg Hx   . Drug abuse Neg Hx   . Early death Neg Hx   . Hearing loss Neg Hx   . Heart disease Neg Hx   . Hyperlipidemia Neg Hx   . Hypertension Neg Hx   . Kidney disease Neg Hx   . Learning disabilities Neg Hx   . Mental illness Neg Hx   . Mental retardation Neg Hx   . Miscarriages / Stillbirths Neg Hx   . Stroke Neg Hx   . Vision loss Neg Hx   . Diabetes Mother     Social History  Substance Use Topics  . Smoking status: Never Smoker   . Smokeless tobacco: Never Used  . Alcohol Use: No    Allergies: No Known Allergies  Prescriptions prior to admission  Medication Sig Dispense Refill Last Dose  . acetaminophen (TYLENOL) 325 MG tablet Take 2 tablets (650 mg total) by mouth every 6 (six) hours  as needed for fever or headache. 30 tablet 1   . Prenat w/o A Vit-FeFum-FePo-FA (CONCEPT OB) 130-92.4-1 MG CAPS Take 1 tablet by mouth daily. 30 capsule 12     Review of Systems  Constitutional: Negative for fever and malaise/fatigue.  Gastrointestinal: Positive for nausea and vomiting. Negative for abdominal pain, diarrhea and constipation.  Genitourinary: Negative for dysuria, urgency and frequency.       Neg - vaginal bleeding   Physical Exam   Blood pressure 115/65, pulse 89, temperature 98.2 F (36.8 C), temperature source Oral, resp. rate 18, height 5\' 6"  (1.676 m), weight 154 lb 3.2 oz (69.945 kg), last menstrual period 01/28/2015.  Physical Exam  Nursing note and vitals reviewed. Constitutional: She is oriented to person, place, and time. She appears well-developed and well-nourished. No distress.  HENT:  Head: Normocephalic and atraumatic.  Cardiovascular: Normal rate.   Respiratory: Effort normal.  GI: Soft. Bowel sounds are normal. She exhibits no distension and no mass. There is no tenderness. There is no rebound and no guarding.  Neurological: She is alert and  oriented to person, place, and time.  Skin: Skin is warm and dry. No erythema.  Psychiatric: She has a normal mood and affect.   Results for orders placed or performed during the hospital encounter of 03/15/15 (from the past 24 hour(s))  Urinalysis, Routine w reflex microscopic (not at [facility])     Status: Abnormal   Collection Time: 03/15/15  1:05 PM  Result Value Ref Range   Color, Urine YELLOW YELLOW   APPearance CLEAR CLEAR   Specific Gravity, Urine 1.015 1.005 - 1.030   pH 7.0 5.0 - 8.0   Glucose, UA NEGATIVE NEGATIVE mg/dL   Hgb urine dipstick NEGATIVE NEGATIVE   Bilirubin Urine NEGATIVE NEGATIVE   Ketones, ur 15 (A) NEGATIVE mg/dL   Protein, ur NEGATIVE NEGATIVE mg/dL   Nitrite NEGATIVE NEGATIVE   Leukocytes, UA NEGATIVE NEGATIVE  Pregnancy, urine POC     Status: Abnormal   Collection Time: 03/15/15   1:14 PM  Result Value Ref Range   Preg Test, Ur POSITIVE (A) NEGATIVE    MAU Course  Procedures None  MDM +UPT UA today shows mild dehydration IV LR with 25 mg Phenergan infusion given Patient reports improvement in nausea. No emesis while in MAU.   Assessment and Plan  A: Positive pregnancy test Nausea and vomiting in pregnancy prior to [redacted] weeks gestation   P: Discharge home Rx for Phenergan given to patient First trimester precautions discussed Patient given information about diet for N/V in pregnancy on AVS Patient advised to follow-up with Dr. Kerin Perna as scheduled this week and then Dr. Jodi Mourning to start prenatal care as planned Patient may return to MAU as needed or if her condition were to change or worsen   Luvenia Redden, PA-C  03/15/2015, 1:39 PM

## 2015-03-18 ENCOUNTER — Inpatient Hospital Stay (HOSPITAL_COMMUNITY)
Admission: AD | Admit: 2015-03-18 | Discharge: 2015-03-18 | Disposition: A | Discharge status: Home / Self Care | Payer: Medicaid Other | Source: Ambulatory Visit | Attending: Obstetrics | Admitting: Obstetrics

## 2015-03-18 ENCOUNTER — Encounter (HOSPITAL_COMMUNITY): Payer: Self-pay | Admitting: *Deleted

## 2015-03-18 DIAGNOSIS — Z3A01 Less than 8 weeks gestation of pregnancy: Secondary | ICD-10-CM | POA: Insufficient documentation

## 2015-03-18 DIAGNOSIS — E876 Hypokalemia: Secondary | ICD-10-CM | POA: Diagnosis not present

## 2015-03-18 DIAGNOSIS — O219 Vomiting of pregnancy, unspecified: Secondary | ICD-10-CM | POA: Diagnosis not present

## 2015-03-18 DIAGNOSIS — O210 Mild hyperemesis gravidarum: Secondary | ICD-10-CM | POA: Insufficient documentation

## 2015-03-18 LAB — CBC
HCT: 32.5 % — ABNORMAL LOW (ref 36.0–46.0)
HEMOGLOBIN: 10.6 g/dL — AB (ref 12.0–15.0)
MCH: 23.1 pg — AB (ref 26.0–34.0)
MCHC: 32.6 g/dL (ref 30.0–36.0)
MCV: 70.8 fL — ABNORMAL LOW (ref 78.0–100.0)
Platelets: 353 10*3/uL (ref 150–400)
RBC: 4.59 MIL/uL (ref 3.87–5.11)
RDW: 16.2 % — ABNORMAL HIGH (ref 11.5–15.5)
WBC: 5.1 10*3/uL (ref 4.0–10.5)

## 2015-03-18 LAB — COMPREHENSIVE METABOLIC PANEL
ALK PHOS: 48 U/L (ref 38–126)
ALT: 38 U/L (ref 14–54)
AST: 23 U/L (ref 15–41)
Albumin: 3.5 g/dL (ref 3.5–5.0)
Anion gap: 8 (ref 5–15)
BUN: 6 mg/dL (ref 6–20)
CALCIUM: 8.6 mg/dL — AB (ref 8.9–10.3)
CO2: 23 mmol/L (ref 22–32)
CREATININE: 0.64 mg/dL (ref 0.44–1.00)
Chloride: 104 mmol/L (ref 101–111)
GFR calc non Af Amer: >60 mL/min (ref >60)
Glucose, Bld: 127 mg/dL — ABNORMAL HIGH (ref 65–99)
Potassium: 3 mmol/L — ABNORMAL LOW (ref 3.5–5.1)
Sodium: 135 mmol/L (ref 135–145)
Total Bilirubin: 0.6 mg/dL (ref 0.3–1.2)
Total Protein: 7 g/dL (ref 6.5–8.1)

## 2015-03-18 LAB — URINE MICROSCOPIC-ADD ON: RBC / HPF: NONE SEEN RBC/hpf (ref 0–5)

## 2015-03-18 LAB — URINALYSIS, ROUTINE W REFLEX MICROSCOPIC
BILIRUBIN URINE: NEGATIVE
Glucose, UA: NEGATIVE mg/dL
HGB URINE DIPSTICK: NEGATIVE
Ketones, ur: 15 mg/dL — AB
Leukocytes, UA: NEGATIVE
Nitrite: NEGATIVE
PROTEIN: 30 mg/dL — AB
Specific Gravity, Urine: 1.02 (ref 1.005–1.030)
pH: 6.5 (ref 5.0–8.0)

## 2015-03-18 MED ORDER — M.V.I. ADULT IV INJ
Freq: Once | INTRAVENOUS | Status: AC
  Administered 2015-03-18: 14:00:00 via INTRAVENOUS
  Filled 2015-03-18: qty 10

## 2015-03-18 MED ORDER — METOCLOPRAMIDE HCL 5 MG/ML IJ SOLN
10.0000 mg | Freq: Once | INTRAMUSCULAR | Status: AC
  Administered 2015-03-18: 10 mg via INTRAVENOUS
  Filled 2015-03-18: qty 2

## 2015-03-18 MED ORDER — POTASSIUM CHLORIDE CRYS ER 20 MEQ PO TBCR: 20.0000 meq | EXTENDED_RELEASE_TABLET | Freq: Two times a day (BID) | ORAL | Status: DC

## 2015-03-18 MED ORDER — PROMETHAZINE HCL 12.5 MG PO TABS: 25.0000 mg | ORAL_TABLET | Freq: Four times a day (QID) | ORAL | Status: DC | PRN

## 2015-03-18 NOTE — MAU Note (Signed)
C/o nausea and vomiting for past week; c/o weakness since yesterday; seen in MAU  3days ago and has rx for phenergan; unable to eat due to nausea but has not vomited today;

## 2015-03-18 NOTE — MAU Note (Signed)
C/o N&V but the first thing she asked for was cranberry juice; has phenergan rx; has not vomited today but she states that she can not eat because of her nausea;

## 2015-03-18 NOTE — MAU Provider Note (Signed)
History     CSN: [id number]  Arrival date and time: 03/18/15 1236   First Provider Initiated Contact with Patient 03/18/15 1349         Chief Complaint  Patient presents with  . Morning Sickness  . Emesis  . Fatigue   HPI  Neyra Hajdu is a 30 y.o. G2P1001 at [redacted]w[redacted]d who presents who presents for n/v. Symptoms have continued since last visit to MAU on 3/5. Reports no vomiting today but continues to be nauseated & unable to tolerate food. Denies abdominal pain, fever, diarrhea, constipation, or vaginal bleeding. Has not taking phenergan since yesterday morning b/c she had to drive.   OB History    Gravida Para Term Preterm AB TAB SAB Ectopic Multiple Living   2 1 1       1       Past Medical History  Diagnosis Date  . PCOS (polycystic ovarian syndrome)     Past Surgical History  Procedure Laterality Date  . No past surgeries      Family History  Problem Relation Age of Onset  . Alcohol abuse Neg Hx   . Arthritis Neg Hx   . Asthma Neg Hx   . Birth defects Neg Hx   . Cancer Neg Hx   . COPD Neg Hx   . Depression Neg Hx   . Drug abuse Neg Hx   . Early death Neg Hx   . Hearing loss Neg Hx   . Heart disease Neg Hx   . Hyperlipidemia Neg Hx   . Hypertension Neg Hx   . Kidney disease Neg Hx   . Learning disabilities Neg Hx   . Mental illness Neg Hx   . Mental retardation Neg Hx   . Miscarriages / Stillbirths Neg Hx   . Stroke Neg Hx   . Vision loss Neg Hx   . Diabetes Mother     Social History  Substance Use Topics  . Smoking status: Never Smoker   . Smokeless tobacco: Never Used  . Alcohol Use: No    Allergies: No Known Allergies  Prescriptions prior to admission  Medication Sig Dispense Refill Last Dose  . acetaminophen (TYLENOL) 325 MG tablet Take 2 tablets (650 mg total) by mouth every 6 (six) hours as needed for fever or headache. (Patient not taking: Reported on 03/15/2015) 30 tablet 1 Not Taking at Unknown time  . Prenat w/o A Vit-FeFum-FePo-FA  (CONCEPT OB) 130-92.4-1 MG CAPS Take 1 tablet by mouth daily. 30 capsule 12   . promethazine (PHENERGAN) 12.5 MG tablet Take 1 tablet (12.5 mg total) by mouth every 6 (six) hours as needed for nausea or vomiting. 30 tablet 0     Review of Systems  Constitutional: Negative for fever, chills and weight loss.  HENT: Negative.   Gastrointestinal: Positive for nausea. Negative for vomiting, abdominal pain, diarrhea and constipation.  Genitourinary: Negative.   Neurological: Positive for weakness.   Physical Exam   Blood pressure 103/57, pulse 82, temperature 97.4 F (36.3 C), temperature source Oral, resp. rate 16, last menstrual period 01/28/2015.  Physical Exam  Nursing note and vitals reviewed. Constitutional: She is oriented to person, place, and time. She appears well-developed and well-nourished. No distress.  HENT:  Head: Normocephalic and atraumatic.  Eyes: Conjunctivae are normal. Right eye exhibits no discharge. Left eye exhibits no discharge. No scleral icterus.  Neck: Normal range of motion.  Cardiovascular: Normal rate, regular rhythm and normal heart sounds.   No murmur heard. Respiratory:  Effort normal and breath sounds normal. No respiratory distress. She has no wheezes.  GI: Soft. Bowel sounds are normal. She exhibits no distension. There is no tenderness.  Neurological: She is alert and oriented to person, place, and time.  Skin: Skin is warm and dry. She is not diaphoretic.  Psychiatric: She has a normal mood and affect. Her behavior is normal. Judgment and thought content normal.    MAU Course  Procedures Results for orders placed or performed during the hospital encounter of 03/18/15 (from the past 24 hour(s))  Urinalysis, Routine w reflex microscopic (not at [facility])     Status: Abnormal   Collection Time: 03/18/15 12:43 PM  Result Value Ref Range   Color, Urine YELLOW YELLOW   APPearance HAZY (A) CLEAR   Specific Gravity, Urine 1.020 1.005 - 1.030   pH 6.5 5.0  - 8.0   Glucose, UA NEGATIVE NEGATIVE mg/dL   Hgb urine dipstick NEGATIVE NEGATIVE   Bilirubin Urine NEGATIVE NEGATIVE   Ketones, ur 15 (A) NEGATIVE mg/dL   Protein, ur 30 (A) NEGATIVE mg/dL   Nitrite NEGATIVE NEGATIVE   Leukocytes, UA NEGATIVE NEGATIVE  Urine microscopic-add on     Status: Abnormal   Collection Time: 03/18/15 12:43 PM  Result Value Ref Range   Squamous Epithelial / LPF 6-30 (A) NONE SEEN   WBC, UA 0-5 0 - 5 WBC/hpf   RBC / HPF NONE SEEN 0 - 5 RBC/hpf   Bacteria, UA RARE (A) NONE SEEN   Urine-Other MUCOUS PRESENT   CBC     Status: Abnormal   Collection Time: 03/18/15  2:32 PM  Result Value Ref Range   WBC 5.1 4.0 - 10.5 K/uL   RBC 4.59 3.87 - 5.11 MIL/uL   Hemoglobin 10.6 (L) 12.0 - 15.0 g/dL   HCT 32.5 (L) 36.0 - 46.0 %   MCV 70.8 (L) 78.0 - 100.0 fL   MCH 23.1 (L) 26.0 - 34.0 pg   MCHC 32.6 30.0 - 36.0 g/dL   RDW 16.2 (H) 11.5 - 15.5 %   Platelets 353 150 - 400 K/uL  Comprehensive metabolic panel     Status: Abnormal   Collection Time: 03/18/15  2:32 PM  Result Value Ref Range   Sodium 135 135 - 145 mmol/L   Potassium 3.0 (L) 3.5 - 5.1 mmol/L   Chloride 104 101 - 111 mmol/L   CO2 23 22 - 32 mmol/L   Glucose, Bld 127 (H) 65 - 99 mg/dL   BUN 6 6 - 20 mg/dL   Creatinine, Ser 0.64 0.44 - 1.00 mg/dL   Calcium 8.6 (L) 8.9 - 10.3 mg/dL   Total Protein 7.0 6.5 - 8.1 g/dL   Albumin 3.5 3.5 - 5.0 g/dL   AST 23 15 - 41 U/L   ALT 38 14 - 54 U/L   Alkaline Phosphatase 48 38 - 126 U/L   Total Bilirubin 0.6 0.3 - 1.2 mg/dL   GFR calc non Af Amer >60 >60 mL/min   GFR calc Af Amer >60 >60 mL/min   Anion gap 8 5 - 15    MDM IV fluids - MVI in D5 LR Reglan 10 mg IV Pt not observed vomiting in MAU Will rx kdur for hypokalemia Pt requesting refill for phenergan  Assessment and Plan  A: 1. Nausea and vomiting during pregnancy prior to [redacted] weeks gestation   2. Hypokalemia      P; Discharge home Rx phenergan refill & Kdur 20 meq bid x 5  days Discussed  reasons to return Keep f/u with ob  Jorje Guild 03/18/2015, 1:49 PM

## 2015-03-18 NOTE — Discharge Instructions (Signed)
Take Kdur (potassium) with food.   Morning Sickness Morning sickness is when you feel sick to your stomach (nauseous) during pregnancy. This nauseous feeling may or may not come with vomiting. It often occurs in the morning but can be a problem any time of day. Morning sickness is most common during the first trimester, but it may continue throughout pregnancy. While morning sickness is unpleasant, it is usually harmless unless you develop severe and continual vomiting (hyperemesis gravidarum). This condition requires more intense treatment.  CAUSES  The cause of morning sickness is not completely known but seems to be related to normal hormonal changes that occur in pregnancy. RISK FACTORS You are at greater risk if you:  Experienced nausea or vomiting before your pregnancy.  Had morning sickness during a previous pregnancy.  Are pregnant with more than one baby, such as twins. TREATMENT  Do not use any medicines (prescription, over-the-counter, or herbal) for morning sickness without first talking to your health care provider. Your health care provider may prescribe or recommend:  Vitamin B6 supplements.  Anti-nausea medicines.  The herbal medicine ginger. HOME CARE INSTRUCTIONS   Only take over-the-counter or prescription medicines as directed by your health care provider.  Taking multivitamins before getting pregnant can prevent or decrease the severity of morning sickness in most women.  Eat a piece of dry toast or unsalted crackers before getting out of bed in the morning.  Eat five or six small meals a day.  Eat dry and bland foods (rice, baked potato). Foods high in carbohydrates are often helpful.  Do not drink liquids with your meals. Drink liquids between meals.  Avoid greasy, fatty, and spicy foods.  Get someone to cook for you if the smell of any food causes nausea and vomiting.  If you feel nauseous after taking prenatal vitamins, take the vitamins at night or  with a snack.  Snack on protein foods (nuts, yogurt, cheese) between meals if you are hungry.  Eat unsweetened gelatins for desserts.  Wearing an acupressure wristband (worn for sea sickness) may be helpful.  Acupuncture may be helpful.  Do not smoke.  Get a humidifier to keep the air in your house free of odors.  Get plenty of fresh air. SEEK MEDICAL CARE IF:   Your home remedies are not working, and you need medicine.  You feel dizzy or lightheaded.  You are losing weight. SEEK IMMEDIATE MEDICAL CARE IF:   You have persistent and uncontrolled nausea and vomiting.  You pass out (faint). MAKE SURE YOU:  Understand these instructions.  Will watch your condition.  Will get help right away if you are not doing well or get worse.   This information is not intended to replace advice given to you by your health care provider. Make sure you discuss any questions you have with your health care provider.   Document Released: 02/17/2006 Document Revised: 01/01/2013 Document Reviewed: 06/13/2012 Elsevier Interactive Patient Education 2016 [employer]. Hypokalemia Hypokalemia means that the amount of potassium in the blood is lower than normal.Potassium is a chemical, called an electrolyte, that helps regulate the amount of fluid in the body. It also stimulates muscle contraction and helps nerves function properly.Most of the body's potassium is inside of cells, and only a very small amount is in the blood. Because the amount in the blood is so small, minor changes can be life-threatening. CAUSES  Antibiotics.  Diarrhea or vomiting.  Using laxatives too much, which can cause diarrhea.  Chronic kidney disease.  Water pills (diuretics).  Eating disorders (bulimia).  Low magnesium level.  Sweating a lot. SIGNS AND SYMPTOMS  Weakness.  Constipation.  Fatigue.  Muscle cramps.  Mental confusion.  Skipped heartbeats or irregular heartbeat  (palpitations).  Tingling or numbness. DIAGNOSIS  Your health care provider can diagnose hypokalemia with blood tests. In addition to checking your potassium level, your health care provider may also check other lab tests. TREATMENT Hypokalemia can be treated with potassium supplements taken by mouth or adjustments in your current medicines. If your potassium level is very low, you may need to get potassium through a vein (IV) and be monitored in the hospital. A diet high in potassium is also helpful. Foods high in potassium are:  Nuts, such as peanuts and pistachios.  Seeds, such as sunflower seeds and pumpkin seeds.  Peas, lentils, and lima beans.  Whole grain and bran cereals and breads.  Fresh fruit and vegetables, such as apricots, avocado, bananas, cantaloupe, kiwi, oranges, tomatoes, asparagus, and potatoes.  Orange and tomato juices.  Red meats.  Fruit yogurt. HOME CARE INSTRUCTIONS  Take all medicines as prescribed by your health care provider.  Maintain a healthy diet by including nutritious food, such as fruits, vegetables, nuts, whole grains, and lean meats.  If you are taking a laxative, be sure to follow the directions on the label. SEEK MEDICAL CARE IF:  Your weakness gets worse.  You feel your heart pounding or racing.  You are vomiting or having diarrhea.  You are diabetic and having trouble keeping your blood glucose in the normal range. SEEK IMMEDIATE MEDICAL CARE IF:  You have chest pain, shortness of breath, or dizziness.  You are vomiting or having diarrhea for more than 2 days.  You faint. MAKE SURE YOU:   Understand these instructions.  Will watch your condition.  Will get help right away if you are not doing well or get worse.   This information is not intended to replace advice given to you by your health care provider. Make sure you discuss any questions you have with your health care provider.   Document Released: 12/27/2004 Document  Revised: 01/17/2014 Document Reviewed: 06/29/2012 Elsevier Interactive Patient Education [employer].

## 2015-04-09 ENCOUNTER — Ambulatory Visit (INDEPENDENT_AMBULATORY_CARE_PROVIDER_SITE_OTHER): Payer: Medicaid Other | Admitting: Certified Nurse Midwife

## 2015-04-09 ENCOUNTER — Encounter: Payer: Self-pay | Admitting: Certified Nurse Midwife

## 2015-04-09 ENCOUNTER — Encounter (HOSPITAL_COMMUNITY): Payer: Self-pay | Admitting: Certified Nurse Midwife

## 2015-04-09 VITALS — BP 116/75 | HR 96 | Temp 98.4°F | Wt 146.0 lb

## 2015-04-09 DIAGNOSIS — O30041 Twin pregnancy, dichorionic/diamniotic, first trimester: Secondary | ICD-10-CM

## 2015-04-09 DIAGNOSIS — O321XX0 Maternal care for breech presentation, not applicable or unspecified: Secondary | ICD-10-CM | POA: Insufficient documentation

## 2015-04-09 DIAGNOSIS — K5901 Slow transit constipation: Secondary | ICD-10-CM

## 2015-04-09 DIAGNOSIS — O219 Vomiting of pregnancy, unspecified: Secondary | ICD-10-CM

## 2015-04-09 DIAGNOSIS — Z3481 Encounter for supervision of other normal pregnancy, first trimester: Secondary | ICD-10-CM | POA: Diagnosis not present

## 2015-04-09 DIAGNOSIS — O30001 Twin pregnancy, unspecified number of placenta and unspecified number of amniotic sacs, first trimester: Secondary | ICD-10-CM

## 2015-04-09 LAB — POCT URINALYSIS DIPSTICK
Bilirubin, UA: NEGATIVE
Glucose, UA: NEGATIVE
Leukocytes, UA: NEGATIVE
Nitrite, UA: NEGATIVE
PH UA: 5
RBC UA: NEGATIVE
SPEC GRAV UA: 1.02
UROBILINOGEN UA: NEGATIVE

## 2015-04-09 MED ORDER — DOXYLAMINE-PYRIDOXINE 10-10 MG PO TBEC: DELAYED_RELEASE_TABLET | ORAL | Status: DC

## 2015-04-09 MED ORDER — DOCUSATE SODIUM 100 MG PO CAPS: 100.0000 mg | ORAL_CAPSULE | Freq: Two times a day (BID) | ORAL | Status: DC

## 2015-04-09 NOTE — Progress Notes (Signed)
Subjective:    Jordan Holt is being seen today for her first obstetrical visit.  Took clomid for about 2 months.  This is a planned pregnancy. She is at [redacted]w[redacted]d gestation. Her obstetrical history is significant for small infant, not premature. Relationship with FOB: spouse, living together. Patient does intend to breast feed. Pregnancy history fully reviewed.  The information documented in the HPI was reviewed and verified.  Menstrual History: OB History    Gravida Para Term Preterm AB TAB SAB Ectopic Multiple Living   2 1 1       1       Menarche age: 30 years of age.    Patient's last menstrual period was 01/28/2015.    Past Medical History  Diagnosis Date  . PCOS (polycystic ovarian syndrome)     Past Surgical History  Procedure Laterality Date  . No past surgeries       (Not in a hospital admission) No Known Allergies  Social History  Substance Use Topics  . Smoking status: Never Smoker   . Smokeless tobacco: Never Used  . Alcohol Use: No    Family History  Problem Relation Age of Onset  . Alcohol abuse Neg Hx   . Arthritis Neg Hx   . Asthma Neg Hx   . Birth defects Neg Hx   . Cancer Neg Hx   . COPD Neg Hx   . Depression Neg Hx   . Drug abuse Neg Hx   . Early death Neg Hx   . Hearing loss Neg Hx   . Heart disease Neg Hx   . Hyperlipidemia Neg Hx   . Hypertension Neg Hx   . Kidney disease Neg Hx   . Learning disabilities Neg Hx   . Mental illness Neg Hx   . Mental retardation Neg Hx   . Miscarriages / Stillbirths Neg Hx   . Stroke Neg Hx   . Vision loss Neg Hx   . Diabetes Mother      Review of Systems Constitutional: negative for weight loss Gastrointestinal: + for nausea & vomiting Genitourinary:negative for genital lesions and vaginal discharge and dysuria Musculoskeletal:negative for back pain Behavioral/Psych: negative for abusive relationship, depression, illegal drug usage and tobacco use    Objective:    BP 116/75 mmHg  Pulse 96   Temp(Src) 98.4 F (36.9 C)  Wt 146 lb (66.225 kg)  LMP 01/28/2015 General Appearance:    Alert, cooperative, no distress, appears stated age  Head:    Normocephalic, without obvious abnormality, atraumatic  Eyes:    PERRL, conjunctiva/corneas clear, EOM's intact, fundi    benign, both eyes  Ears:    Normal TM's and external ear canals, both ears  Nose:   Nares normal, septum midline, mucosa normal, no drainage    or sinus tenderness  Throat:   Lips, mucosa, and tongue normal; teeth and gums normal  Neck:   Supple, symmetrical, trachea midline, no adenopathy;    thyroid:  no enlargement/tenderness/nodules; no carotid   bruit or JVD  Back:     Symmetric, no curvature, ROM normal, no CVA tenderness  Lungs:     Clear to auscultation bilaterally, respirations unlabored  Chest Wall:    No tenderness or deformity   Heart:    Regular rate and rhythm, S1 and S2 normal, no murmur, rub   or gallop  Breast Exam:    No tenderness, masses, or nipple abnormality  Abdomen:     Soft, non-tender, bowel sounds active all four  quadrants,    no masses, no organomegaly  Genitalia:    Normal female without lesion, discharge or tenderness  Extremities:   Extremities normal, atraumatic, no cyanosis or edema  Pulses:   2+ and symmetric all extremities  Skin:   Skin color, texture, turgor normal, no rashes or lesions  Lymph nodes:   Cervical, supraclavicular, and axillary nodes normal  Neurologic:   CNII-XII intact, normal strength, sensation and reflexes    throughout         Cervix: long, thick, closed and posterior.  Fetal heart rate of one twin seen with bedside US.  Other twin difficult to visualize.     Seems to have 2 gestational sacks.      Lab Review Urine pregnancy test Labs reviewed yes Radiologic studies reviewed no Assessment:    Pregnancy at [redacted]w[redacted]d weeks   Twin pregnancy  Plan:     MFM consult Prenatal vitamins.  Counseling provided regarding continued use of seat belts, cessation of  alcohol consumption, smoking or use of illicit drugs; infection precautions i.e., influenza/TDAP immunizations, toxoplasmosis,CMV, parvovirus, listeria and varicella; workplace safety, exercise during pregnancy; routine dental care, safe medications, sexual activity, hot tubs, saunas, pools, travel, caffeine use, fish and methlymercury, potential toxins, hair treatments, varicose veins Weight gain recommendations per IOM guidelines reviewed: underweight/BMI< 18.5--> gain 28 - 40 lbs; normal weight/BMI 18.5 - 24.9--> gain 25 - 35 lbs; overweight/BMI 25 - 29.9--> gain 15 - 25 lbs; obese/BMI >30->gain  11 - 20 lbs Problem list reviewed and updated. FIRST/CF mutation testing/NIPT/QUAD SCREEN/fragile X/Ashkenazi Jewish population testing/Spinal muscular atrophy discussed: requested. Role of ultrasound in pregnancy discussed; fetal survey: requested. Amniocentesis discussed: not indicated. VBAC calculator score: VBAC consent form provided Meds ordered this encounter  Medications  . Doxylamine-Pyridoxine (DICLEGIS) 10-10 MG TBEC    Sig: Take 1 tablet with breakfast and lunch.  Take 2 tablets at bedtime.    Dispense:  100 tablet    Refill:  4  . docusate sodium (COLACE) 100 MG capsule    Sig: Take 1 capsule (100 mg total) by mouth 2 (two) times daily.    Dispense:  90 capsule    Refill:  1   Orders Placed This Encounter  Procedures  . Culture, OB Urine  . [place] MFM Fetal Nuchal Translucency    Standing Status: Future     Number of Occurrences:      Standing Expiration Date: 06/08/2016    Order Specific Question:  Reason for Exam (SYMPTOM  OR DIAGNOSIS REQUIRED)    Answer:  twin gestation    Order Specific Question:  Preferred imaging location?    Answer:  [facility]-Ultrasound  . [place] MFM OB Comp Less 14 Wks    Standing Status: Future     Number of Occurrences:      Standing Expiration Date: 06/08/2016    Order Specific Question:  Reason for Exam (SYMPTOM  OR DIAGNOSIS REQUIRED)    Answer:  twin  gestation    Order Specific Question:  Preferred imaging location?    Answer:  [facility]-Ultrasound  . HIV antibody  . Hemoglobinopathy evaluation  . Varicella zoster antibody, IgG  . VITAMIN D 25 Hydroxy (Vit-D Deficiency, Fractures)  . Prenatal Profile I  . NuSwab Vaginitis Plus (VG+)  . AMB referral to maternal fetal medicine    Referral Priority:  Routine    Referral Type:  Consultation    Referral Reason:  Specialty Services Required    Number of Visits Requested:  1  .  POCT urinalysis dipstick    Follow up in 4 weeks. 50% of 30 min visit spent on counseling and coordination of care.

## 2015-04-10 ENCOUNTER — Telehealth: Payer: Self-pay | Admitting: *Deleted

## 2015-04-10 ENCOUNTER — Other Ambulatory Visit: Payer: Self-pay | Admitting: Certified Nurse Midwife

## 2015-04-10 LAB — PRENATAL PROFILE I(LABCORP)
Antibody Screen: NEGATIVE
BASOS ABS: 0 10*3/uL (ref 0.0–0.2)
Basos: 0 %
EOS (ABSOLUTE): 0.3 10*3/uL (ref 0.0–0.4)
EOS: 5 %
HEMOGLOBIN: 11.9 g/dL (ref 11.1–15.9)
Hematocrit: 37.3 % (ref 34.0–46.6)
Hepatitis B Surface Ag: NEGATIVE
IMMATURE GRANULOCYTES: 0 %
Immature Grans (Abs): 0 10*3/uL (ref 0.0–0.1)
LYMPHS ABS: 2 10*3/uL (ref 0.7–3.1)
Lymphs: 28 %
MCH: 23.5 pg — AB (ref 26.6–33.0)
MCHC: 31.9 g/dL (ref 31.5–35.7)
MCV: 74 fL — ABNORMAL LOW (ref 79–97)
Monocytes Absolute: 0.4 10*3/uL (ref 0.1–0.9)
Monocytes: 6 %
NEUTROS PCT: 61 %
Neutrophils Absolute: 4.3 10*3/uL (ref 1.4–7.0)
PLATELETS: 364 10*3/uL (ref 150–379)
RBC: 5.07 x10E6/uL (ref 3.77–5.28)
RDW: 17.4 % — AB (ref 12.3–15.4)
RH TYPE: POSITIVE
RPR Ser Ql: NONREACTIVE
Rubella Antibodies, IGG: 2.96 index (ref >0.99)
WBC: 7.1 10*3/uL (ref 3.4–10.8)

## 2015-04-10 LAB — HEMOGLOBINOPATHY EVALUATION
HEMOGLOBIN A2 QUANTITATION: 2.2 % (ref 0.7–3.1)
HEMOGLOBIN F QUANTITATION: 0 % (ref 0.0–2.0)
HGB C: 0 %
HGB S: 0 %
Hgb A: 97.8 % (ref 94.0–98.0)

## 2015-04-10 LAB — VITAMIN D 25 HYDROXY (VIT D DEFICIENCY, FRACTURES): VIT D 25 HYDROXY: 12.4 ng/mL — AB (ref 30.0–100.0)

## 2015-04-10 LAB — VARICELLA ZOSTER ANTIBODY, IGG

## 2015-04-10 LAB — HIV ANTIBODY (ROUTINE TESTING W REFLEX): HIV Screen 4th Generation wRfx: NONREACTIVE

## 2015-04-10 NOTE — Telephone Encounter (Signed)
Pt called to office regarding Rx.  Return call to pt. Pt states that medication for nausea is too expensive.  Pt has Medicaid insurance. Pt made aware that PA could be entered for coverage. Pt advised to f/u at pharmacy to determine availability.

## 2015-04-11 LAB — CULTURE, OB URINE

## 2015-04-11 LAB — URINE CULTURE, OB REFLEX

## 2015-04-13 LAB — NUSWAB VAGINITIS PLUS (VG+)
CHLAMYDIA TRACHOMATIS, NAA: NEGATIVE
Candida albicans, NAA: NEGATIVE
Candida glabrata, NAA: NEGATIVE
Neisseria gonorrhoeae, NAA: NEGATIVE
Trich vag by NAA: NEGATIVE

## 2015-04-14 LAB — PAP IG W/ RFLX HPV ASCU: PAP Smear Comment: 0

## 2015-04-21 ENCOUNTER — Telehealth: Payer: Self-pay | Admitting: Certified Nurse Midwife

## 2015-04-21 DIAGNOSIS — Z029 Encounter for administrative examinations, unspecified: Secondary | ICD-10-CM

## 2015-04-21 NOTE — Telephone Encounter (Signed)
Prior approval has been submitted. Awaiting approval, may take up to 24 hours for approval.

## 2015-04-21 NOTE — Telephone Encounter (Signed)
Pt needs prior auth for Diclegis

## 2015-04-21 NOTE — Telephone Encounter (Signed)
Pt states that she went to the pharmacy and tried to pick up a medication, Dicgleis, but the pharmacy told her that it hadn't been paid for. Pt states that needs to talk to [facility] prior to her FMLA papers being completed. Form fee of $15 paid already. Please advise

## 2015-04-22 ENCOUNTER — Other Ambulatory Visit: Payer: Self-pay | Admitting: Certified Nurse Midwife

## 2015-04-23 ENCOUNTER — Telehealth: Payer: Self-pay | Admitting: *Deleted

## 2015-04-23 NOTE — Telephone Encounter (Signed)
Diclegis Rx has been approved via [employer]. Pharmacy made aware.

## 2015-04-30 ENCOUNTER — Other Ambulatory Visit: Payer: Self-pay | Admitting: Certified Nurse Midwife

## 2015-04-30 ENCOUNTER — Encounter (HOSPITAL_COMMUNITY): Payer: Self-pay

## 2015-04-30 ENCOUNTER — Ambulatory Visit (HOSPITAL_COMMUNITY)
Admission: RE | Admit: 2015-04-30 | Discharge: 2015-04-30 | Disposition: A | Discharge status: Home / Self Care | Payer: Medicaid Other | Source: Ambulatory Visit | Attending: Certified Nurse Midwife | Admitting: Certified Nurse Midwife

## 2015-04-30 DIAGNOSIS — O09291 Supervision of pregnancy with other poor reproductive or obstetric history, first trimester: Secondary | ICD-10-CM

## 2015-04-30 DIAGNOSIS — Z36 Encounter for antenatal screening of mother: Secondary | ICD-10-CM | POA: Diagnosis not present

## 2015-04-30 DIAGNOSIS — O09811 Supervision of pregnancy resulting from assisted reproductive technology, first trimester: Secondary | ICD-10-CM | POA: Diagnosis not present

## 2015-04-30 DIAGNOSIS — Z8759 Personal history of other complications of pregnancy, childbirth and the puerperium: Secondary | ICD-10-CM

## 2015-04-30 DIAGNOSIS — O30009 Twin pregnancy, unspecified number of placenta and unspecified number of amniotic sacs, unspecified trimester: Secondary | ICD-10-CM

## 2015-04-30 DIAGNOSIS — Z3689 Encounter for other specified antenatal screening: Secondary | ICD-10-CM

## 2015-04-30 DIAGNOSIS — O30001 Twin pregnancy, unspecified number of placenta and unspecified number of amniotic sacs, first trimester: Secondary | ICD-10-CM

## 2015-04-30 DIAGNOSIS — Z3A13 13 weeks gestation of pregnancy: Secondary | ICD-10-CM

## 2015-05-01 ENCOUNTER — Other Ambulatory Visit (HOSPITAL_COMMUNITY): Payer: Self-pay | Admitting: *Deleted

## 2015-05-01 DIAGNOSIS — O30049 Twin pregnancy, dichorionic/diamniotic, unspecified trimester: Secondary | ICD-10-CM

## 2015-05-07 ENCOUNTER — Other Ambulatory Visit (HOSPITAL_COMMUNITY): Payer: Self-pay

## 2015-05-07 ENCOUNTER — Ambulatory Visit (INDEPENDENT_AMBULATORY_CARE_PROVIDER_SITE_OTHER): Payer: Medicaid Other | Admitting: Certified Nurse Midwife

## 2015-05-07 VITALS — BP 109/70 | HR 95 | Temp 97.8°F | Wt 147.0 lb

## 2015-05-07 DIAGNOSIS — R0981 Nasal congestion: Secondary | ICD-10-CM

## 2015-05-07 DIAGNOSIS — Z3482 Encounter for supervision of other normal pregnancy, second trimester: Secondary | ICD-10-CM

## 2015-05-07 DIAGNOSIS — O30042 Twin pregnancy, dichorionic/diamniotic, second trimester: Secondary | ICD-10-CM

## 2015-05-07 DIAGNOSIS — O9989 Other specified diseases and conditions complicating pregnancy, childbirth and the puerperium: Secondary | ICD-10-CM

## 2015-05-07 DIAGNOSIS — O99891 Other specified diseases and conditions complicating pregnancy: Secondary | ICD-10-CM

## 2015-05-07 LAB — POCT URINALYSIS DIPSTICK
BILIRUBIN UA: NEGATIVE
Glucose, UA: NEGATIVE
Leukocytes, UA: NEGATIVE
NITRITE UA: NEGATIVE
PH UA: 6
PROTEIN UA: NEGATIVE
RBC UA: NEGATIVE
SPEC GRAV UA: 1.01
Urobilinogen, UA: NEGATIVE

## 2015-05-07 MED ORDER — FLUTICASONE PROPIONATE 50 MCG/ACT NA SUSP: 2.0000 | Freq: Every day | NASAL | Status: DC

## 2015-05-07 MED ORDER — VITAFOL GUMMIES 3.33-0.333-34.8 MG PO CHEW: 3.0000 | CHEWABLE_TABLET | Freq: Every day | ORAL | Status: DC

## 2015-05-07 NOTE — Progress Notes (Signed)
Subjective:    Tannaz Bayer is being seen today for her obstetrical visit.  She is at [redacted]w[redacted]d gestation.  Patient reports no bleeding, no contractions, no cramping, no leaking and nasal congestion, encouraged OCT Sudafed.   Fetal Movement: normal.   Problem List Items Addressed This Visit    None    Visit Diagnoses    Encounter for supervision of other normal pregnancy in second trimester    -  Primary    Relevant Orders    POCT urinalysis dipstick (Completed)    Dichorionic diamniotic twin pregnancy in second trimester        Relevant Medications    Prenatal Vit-Fe Phos-FA-Omega (VITAFOL GUMMIES) 3.33-0.333-34.8 MG CHEW    Nasal congestion related to pregnancy        Relevant Medications    fluticasone (FLONASE) 50 MCG/ACT nasal spray      Patient Active Problem List   Diagnosis Date Noted  . Twin pregnancy 04/09/2015  . Normal delivery 12/19/2012  . PIH (pregnancy induced hypertension) 12/13/2012  . Iron (Fe) deficiency anemia 08/07/2012  . Supervision of normal first pregnancy 07/30/2012  . DUB (dysfunctional uterine bleeding) 03/09/2012    Objective:    BP 109/70 mmHg  Pulse 95  Temp(Src) 97.8 F (36.6 C)  Wt 147 lb (66.679 kg)  LMP 01/28/2015 Uterine Size: size equals dates   FHR: Twin A; 162, B: 155  Assessment:    Pregnancy 14 and 1/7 weeks. Twins, dichorionic, diamniotic.   Nasal congestion  Plan:    Problem list reviewed and updated. Labs reviewed. AFP3 discussed and not recommended in twins. Role of ultrasound discussed; fetal survey recommended. Amniocentesis discussed: requested.  Counseled that amniocentesis would require tapping both sacs (with injection of dye) and that risk of miscarriage is higher with additional sacs sampled. Nutrition in twin pregnancy reviewed. Risks of twin pregnancy reviewed, including risk of prematurity, hypertension, diabetes and the possible need for stopping work early or limiting activities.   Follow-up in 4  weeks. 50% of 15 min visit spent on counseling and coordination of care.

## 2015-05-07 NOTE — Progress Notes (Signed)
Patient has no questions or concerns today.

## 2015-05-14 ENCOUNTER — Other Ambulatory Visit: Payer: Self-pay | Admitting: Certified Nurse Midwife

## 2015-06-03 ENCOUNTER — Ambulatory Visit (INDEPENDENT_AMBULATORY_CARE_PROVIDER_SITE_OTHER): Payer: Medicaid Other | Admitting: Certified Nurse Midwife

## 2015-06-03 VITALS — BP 117/71 | HR 88 | Temp 98.4°F | Wt 152.0 lb

## 2015-06-03 DIAGNOSIS — O30042 Twin pregnancy, dichorionic/diamniotic, second trimester: Secondary | ICD-10-CM

## 2015-06-03 LAB — POCT URINALYSIS DIPSTICK
Bilirubin, UA: NEGATIVE
Blood, UA: NEGATIVE
Glucose, UA: NEGATIVE
Ketones, UA: NEGATIVE
Leukocytes, UA: NEGATIVE
Nitrite, UA: NEGATIVE
Protein, UA: NEGATIVE
Spec Grav, UA: <=1.005
Urobilinogen, UA: NEGATIVE
pH, UA: 7

## 2015-06-03 NOTE — Progress Notes (Signed)
Subjective:    Jordan Holt is being seen today for her obstetrical visit.  She is at [redacted]w[redacted]d gestation.  Patient reports no complaints.   Fetal Movement: normal.   Problem List Items Addressed This Visit    None     Patient Active Problem List   Diagnosis Date Noted  . Twin pregnancy 04/09/2015  . Normal delivery 12/19/2012  . PIH (pregnancy induced hypertension) 12/13/2012  . Iron (Fe) deficiency anemia 08/07/2012  . Supervision of normal first pregnancy 07/30/2012  . DUB (dysfunctional uterine bleeding) 03/09/2012    Objective:    BP 117/71 mmHg  Pulse 88  Temp(Src) 98.4 F (36.9 C)  Wt 152 lb (68.947 kg)  LMP 01/28/2015 FHT:  Baby A: 135 BPM;  Baby B:  160 BPM  Uterine Size: 20 cm and consistent with twins     Assessment:    Pregnancy 18 and 0/7 weeks. Twins, dichorionic, diamniotic.   Plan:    Fetal survey discussed: ordered. TB risk assessment: low. Reviewed signs and symptoms of premature labor and PROM.   Discussed the potential for activity modification and planning for maternity leave. Follow-up: 4 weeks.

## 2015-06-03 NOTE — Addendum Note (Signed)
Addended by: Lewie Loron D on: 06/03/2015 03:51 PM   Modules accepted: Orders

## 2015-06-10 ENCOUNTER — Encounter (HOSPITAL_COMMUNITY): Payer: Self-pay

## 2015-06-11 ENCOUNTER — Ambulatory Visit (HOSPITAL_COMMUNITY)
Admission: RE | Admit: 2015-06-11 | Discharge: 2015-06-11 | Disposition: A | Discharge status: Home / Self Care | Payer: Medicaid Other | Source: Ambulatory Visit | Attending: Certified Nurse Midwife | Admitting: Certified Nurse Midwife

## 2015-06-11 ENCOUNTER — Encounter (HOSPITAL_COMMUNITY): Payer: Self-pay

## 2015-06-11 VITALS — BP 109/62 | HR 108 | Wt 155.8 lb

## 2015-06-11 DIAGNOSIS — O30049 Twin pregnancy, dichorionic/diamniotic, unspecified trimester: Secondary | ICD-10-CM

## 2015-06-11 DIAGNOSIS — O30042 Twin pregnancy, dichorionic/diamniotic, second trimester: Secondary | ICD-10-CM | POA: Diagnosis present

## 2015-06-11 DIAGNOSIS — Z3A19 19 weeks gestation of pregnancy: Secondary | ICD-10-CM | POA: Insufficient documentation

## 2015-06-25 ENCOUNTER — Encounter: Payer: Medicaid Other | Admitting: Certified Nurse Midwife

## 2015-06-26 ENCOUNTER — Other Ambulatory Visit: Payer: Self-pay | Admitting: Certified Nurse Midwife

## 2015-06-30 ENCOUNTER — Ambulatory Visit (INDEPENDENT_AMBULATORY_CARE_PROVIDER_SITE_OTHER): Payer: Medicaid Other | Admitting: Certified Nurse Midwife

## 2015-06-30 VITALS — BP 113/67 | HR 100 | Temp 98.6°F | Wt 163.0 lb

## 2015-06-30 DIAGNOSIS — O30042 Twin pregnancy, dichorionic/diamniotic, second trimester: Secondary | ICD-10-CM

## 2015-06-30 NOTE — Progress Notes (Signed)
Subjective:    Jordan Holt is being seen today for her obstetrical visit.  She is at [redacted]w[redacted]d gestation.  Patient reports no complaints.   Fetal Movement: normal.   Problem List Items Addressed This Visit    None     Patient Active Problem List   Diagnosis Date Noted  . Twin pregnancy 04/09/2015  . Normal delivery 12/19/2012  . PIH (pregnancy induced hypertension) 12/13/2012  . Iron (Fe) deficiency anemia 08/07/2012  . Supervision of normal first pregnancy 07/30/2012  . DUB (dysfunctional uterine bleeding) 03/09/2012    Objective:    BP 113/67 mmHg  Pulse 100  Temp(Src) 98.6 F (37 C)  Wt 163 lb (73.936 kg)  LMP 01/28/2015 FHT:  Baby A: 145 BPM;  Baby B:  150 BPM  Uterine Size: consistent with twins     Assessment:    Pregnancy 1 and 6/7 weeks. Twins, dichorionic, diamniotic.   Plan:    Fetal survey discussed: results reviewed. TB risk assessment: low. Reviewed signs and symptoms of premature labor and PROM.   Discussed the potential for activity modification and planning for maternity leave. Follow-up: 4 weeks.

## 2015-07-23 ENCOUNTER — Encounter (HOSPITAL_COMMUNITY): Payer: Self-pay

## 2015-07-23 ENCOUNTER — Ambulatory Visit (HOSPITAL_COMMUNITY)
Admission: RE | Admit: 2015-07-23 | Discharge: 2015-07-23 | Disposition: A | Discharge status: Home / Self Care | Payer: Medicaid Other | Source: Ambulatory Visit | Attending: Certified Nurse Midwife | Admitting: Certified Nurse Midwife

## 2015-07-23 ENCOUNTER — Other Ambulatory Visit (HOSPITAL_COMMUNITY): Payer: Self-pay | Admitting: Maternal and Fetal Medicine

## 2015-07-23 DIAGNOSIS — O09812 Supervision of pregnancy resulting from assisted reproductive technology, second trimester: Secondary | ICD-10-CM

## 2015-07-23 DIAGNOSIS — Z3A25 25 weeks gestation of pregnancy: Secondary | ICD-10-CM | POA: Diagnosis not present

## 2015-07-23 DIAGNOSIS — O30049 Twin pregnancy, dichorionic/diamniotic, unspecified trimester: Secondary | ICD-10-CM

## 2015-07-23 DIAGNOSIS — O30042 Twin pregnancy, dichorionic/diamniotic, second trimester: Secondary | ICD-10-CM | POA: Diagnosis not present

## 2015-07-23 DIAGNOSIS — O09292 Supervision of pregnancy with other poor reproductive or obstetric history, second trimester: Secondary | ICD-10-CM | POA: Diagnosis not present

## 2015-07-23 DIAGNOSIS — O09892 Supervision of other high risk pregnancies, second trimester: Secondary | ICD-10-CM

## 2015-07-28 ENCOUNTER — Ambulatory Visit (INDEPENDENT_AMBULATORY_CARE_PROVIDER_SITE_OTHER): Payer: Medicaid Other | Admitting: Certified Nurse Midwife

## 2015-07-28 VITALS — BP 118/75 | HR 97 | Temp 98.4°F

## 2015-07-28 DIAGNOSIS — O30002 Twin pregnancy, unspecified number of placenta and unspecified number of amniotic sacs, second trimester: Secondary | ICD-10-CM

## 2015-07-28 DIAGNOSIS — Z3482 Encounter for supervision of other normal pregnancy, second trimester: Secondary | ICD-10-CM

## 2015-07-28 DIAGNOSIS — O0992 Supervision of high risk pregnancy, unspecified, second trimester: Secondary | ICD-10-CM

## 2015-07-28 LAB — POCT URINALYSIS DIPSTICK
Bilirubin, UA: NEGATIVE
Glucose, UA: NEGATIVE
Ketones, UA: NEGATIVE
Leukocytes, UA: NEGATIVE
NITRITE UA: NEGATIVE
PH UA: 8
Protein, UA: NEGATIVE
RBC UA: NEGATIVE
UROBILINOGEN UA: NEGATIVE

## 2015-07-28 MED ORDER — ONDANSETRON HCL 4 MG PO TABS: 4.0000 mg | ORAL_TABLET | Freq: Every day | ORAL | Status: DC | PRN

## 2015-07-28 NOTE — Progress Notes (Signed)
Subjective:    Jordan Holt is being seen today for her obstetrical visit.  She is at [redacted]w[redacted]d gestation.  Patient reports fatigue, nausea, no bleeding, no contractions, no cramping and no leaking.   Fetal Movement: normal.   Reports going to [facility] and getting RX for Prometrium to help prevent short cervix.  Problem List Items Addressed This Visit    None    Visit Diagnoses    Encounter for supervision of other normal pregnancy in second trimester    -  Primary    Relevant Orders    POCT urinalysis dipstick (Completed)    Twin gestation in second trimester        Relevant Medications    ondansetron (ZOFRAN) 4 MG tablet    Supervision of high risk pregnancy in second trimester        Relevant Medications    ondansetron (ZOFRAN) 4 MG tablet      Patient Active Problem List   Diagnosis Date Noted  . Twin pregnancy 04/09/2015  . Normal delivery 12/19/2012  . PIH (pregnancy induced hypertension) 12/13/2012  . Iron (Fe) deficiency anemia 08/07/2012  . Supervision of normal first pregnancy 07/30/2012  . DUB (dysfunctional uterine bleeding) 03/09/2012   Objective:    BP 118/75 mmHg  Pulse 97  Temp(Src) 98.4 F (36.9 C)  LMP 01/28/2015 FHT:  Baby A: 150 BPM;  Baby B:  145 BPM  Uterine Size: consistent with twins     Assessment:    Pregnancy @ [redacted]w[redacted]d weeks Twins, dichorionic, diamniotic.  Nausea in the AM. Occasional back pain. Plan:    Ultrasound completed.  Follow-up with MFM for future U/S. OBGCT: ordered for next visit. Signs and symptoms of preterm labor: discussed.  Zofran for nausea. Signs of premature labor and dilation were reviewed.   Discussed fetal positions and related modes of delivery.  Consent form for twins provided. Follow up: 2 weeks with GTT.

## 2015-07-28 NOTE — Progress Notes (Signed)
I agree with note by NP Student Jillyn Ledger.  Was present for exam.  R.Ketra Duchesne CNM

## 2015-08-05 ENCOUNTER — Encounter (HOSPITAL_COMMUNITY): Payer: Self-pay

## 2015-08-06 ENCOUNTER — Encounter (HOSPITAL_COMMUNITY): Payer: Self-pay

## 2015-08-06 ENCOUNTER — Ambulatory Visit (HOSPITAL_COMMUNITY)
Admission: RE | Admit: 2015-08-06 | Discharge: 2015-08-06 | Disposition: A | Discharge status: Home / Self Care | Payer: Medicaid Other | Source: Ambulatory Visit | Attending: Certified Nurse Midwife | Admitting: Certified Nurse Midwife

## 2015-08-06 DIAGNOSIS — Z3A27 27 weeks gestation of pregnancy: Secondary | ICD-10-CM | POA: Insufficient documentation

## 2015-08-06 DIAGNOSIS — O30042 Twin pregnancy, dichorionic/diamniotic, second trimester: Secondary | ICD-10-CM | POA: Diagnosis present

## 2015-08-06 DIAGNOSIS — O26872 Cervical shortening, second trimester: Secondary | ICD-10-CM | POA: Insufficient documentation

## 2015-08-06 DIAGNOSIS — O30049 Twin pregnancy, dichorionic/diamniotic, unspecified trimester: Secondary | ICD-10-CM

## 2015-08-11 ENCOUNTER — Encounter: Payer: Medicaid Other | Admitting: Certified Nurse Midwife

## 2015-08-11 ENCOUNTER — Other Ambulatory Visit: Payer: Medicaid Other

## 2015-08-14 ENCOUNTER — Other Ambulatory Visit: Payer: Medicaid Other

## 2015-08-14 ENCOUNTER — Ambulatory Visit (INDEPENDENT_AMBULATORY_CARE_PROVIDER_SITE_OTHER): Payer: Medicaid Other | Admitting: Obstetrics

## 2015-08-14 VITALS — BP 122/81 | HR 105 | Wt 168.4 lb

## 2015-08-14 DIAGNOSIS — Z3493 Encounter for supervision of normal pregnancy, unspecified, third trimester: Secondary | ICD-10-CM

## 2015-08-14 LAB — POCT URINALYSIS DIPSTICK
BILIRUBIN UA: NEGATIVE
Blood, UA: NEGATIVE
GLUCOSE UA: NORMAL
Ketones, UA: NEGATIVE
Nitrite, UA: NEGATIVE
UROBILINOGEN UA: NEGATIVE
pH, UA: 7

## 2015-08-15 LAB — HIV ANTIBODY (ROUTINE TESTING W REFLEX): HIV SCREEN 4TH GENERATION: NONREACTIVE

## 2015-08-15 LAB — CBC WITH DIFFERENTIAL/PLATELET
BASOS ABS: 0 10*3/uL (ref 0.0–0.2)
Basos: 0 %
EOS (ABSOLUTE): 0.4 10*3/uL (ref 0.0–0.4)
Eos: 4 %
HEMOGLOBIN: 7.5 g/dL — AB (ref 11.1–15.9)
Hematocrit: 24.5 % — ABNORMAL LOW (ref 34.0–46.6)
IMMATURE GRANULOCYTES: 1 %
Immature Grans (Abs): 0.1 10*3/uL (ref 0.0–0.1)
LYMPHS ABS: 2 10*3/uL (ref 0.7–3.1)
LYMPHS: 23 %
MCH: 20.7 pg — ABNORMAL LOW (ref 26.6–33.0)
MCHC: 30.6 g/dL — AB (ref 31.5–35.7)
MCV: 68 fL — ABNORMAL LOW (ref 79–97)
MONOCYTES: 9 %
Monocytes Absolute: 0.8 10*3/uL (ref 0.1–0.9)
NEUTROS PCT: 63 %
Neutrophils Absolute: 5.4 10*3/uL (ref 1.4–7.0)
Platelets: 278 10*3/uL (ref 150–379)
RBC: 3.63 x10E6/uL — AB (ref 3.77–5.28)
RDW: 16.7 % — AB (ref 12.3–15.4)
WBC: 8.6 10*3/uL (ref 3.4–10.8)

## 2015-08-15 LAB — GLUCOSE TOLERANCE, 2 HOURS W/ 1HR
GLUCOSE, 2 HOUR: 113 mg/dL (ref 65–152)
Glucose, 1 hour: 101 mg/dL (ref 65–179)
Glucose, Fasting: 75 mg/dL (ref 65–91)

## 2015-08-15 LAB — RPR: RPR: NONREACTIVE

## 2015-08-17 ENCOUNTER — Encounter: Payer: Self-pay | Admitting: Obstetrics

## 2015-08-17 NOTE — Progress Notes (Signed)
Patient ID: Jordan Holt, female   DOB: 1985-12-19, 30 y.o.   MRN: [id number] Subjective:    Jordan Holt is a 30 y.o. female being seen today for her obstetrical visit. She is at [redacted]w[redacted]d gestation. Patient reports no complaints. Fetal movement: normal.  Problem List Items Addressed This Visit    None    Visit Diagnoses    Prenatal care, third trimester    -  Primary   Relevant Orders   POCT urinalysis dipstick (Completed)   Glucose Tolerance, 2 Hours w/1 Hour (Completed)   CBC w/Diff (Completed)   RPR (Completed)   HIV antibody (Completed)     Patient Active Problem List   Diagnosis Date Noted  . Twin pregnancy 04/09/2015  . Normal delivery 12/19/2012  . PIH (pregnancy induced hypertension) 12/13/2012  . Iron (Fe) deficiency anemia 08/07/2012  . Supervision of normal first pregnancy 07/30/2012  . DUB (dysfunctional uterine bleeding) 03/09/2012   Objective:    BP 122/81   Pulse (!) 105   Wt 168 lb 6 oz (76.4 kg)   LMP 01/28/2015   BMI 27.18 kg/m  FHT:  150 BPM  Uterine Size: size equals dates  Presentation: unsure     Assessment:    Pregnancy @ [redacted]w[redacted]d weeks   Plan:     labs reviewed, problem list updated Consent signed. GBS sent TDAP offered  Rhogam given for RH negative Pediatrician: discussed. Infant feeding: plans to breastfeed. Maternity leave: discussed.  Orders Placed This Encounter  Procedures  . Glucose Tolerance, 2 Hours w/1 Hour  . CBC w/Diff  . RPR  . HIV antibody  . POCT urinalysis dipstick   No orders of the defined types were placed in this encounter.  Follow up in 2 Weeks.

## 2015-08-20 ENCOUNTER — Other Ambulatory Visit (HOSPITAL_COMMUNITY): Payer: Self-pay | Admitting: Maternal and Fetal Medicine

## 2015-08-20 ENCOUNTER — Ambulatory Visit (HOSPITAL_COMMUNITY)
Admission: RE | Admit: 2015-08-20 | Discharge: 2015-08-20 | Disposition: A | Discharge status: Home / Self Care | Payer: Medicaid Other | Source: Ambulatory Visit | Attending: Certified Nurse Midwife | Admitting: Certified Nurse Midwife

## 2015-08-20 ENCOUNTER — Encounter (HOSPITAL_COMMUNITY): Payer: Self-pay

## 2015-08-20 DIAGNOSIS — O09293 Supervision of pregnancy with other poor reproductive or obstetric history, third trimester: Secondary | ICD-10-CM

## 2015-08-20 DIAGNOSIS — Z3A29 29 weeks gestation of pregnancy: Secondary | ICD-10-CM

## 2015-08-20 DIAGNOSIS — O26873 Cervical shortening, third trimester: Secondary | ICD-10-CM

## 2015-08-20 DIAGNOSIS — O09893 Supervision of other high risk pregnancies, third trimester: Secondary | ICD-10-CM

## 2015-08-20 DIAGNOSIS — O09813 Supervision of pregnancy resulting from assisted reproductive technology, third trimester: Secondary | ICD-10-CM

## 2015-08-20 DIAGNOSIS — O30043 Twin pregnancy, dichorionic/diamniotic, third trimester: Secondary | ICD-10-CM

## 2015-08-20 DIAGNOSIS — O30049 Twin pregnancy, dichorionic/diamniotic, unspecified trimester: Secondary | ICD-10-CM

## 2015-08-28 ENCOUNTER — Encounter: Payer: Self-pay | Admitting: *Deleted

## 2015-08-28 ENCOUNTER — Ambulatory Visit (INDEPENDENT_AMBULATORY_CARE_PROVIDER_SITE_OTHER): Payer: Medicaid Other | Admitting: Certified Nurse Midwife

## 2015-08-28 VITALS — BP 118/71 | HR 102 | Temp 98.3°F | Wt 170.8 lb

## 2015-08-28 DIAGNOSIS — Z3493 Encounter for supervision of normal pregnancy, unspecified, third trimester: Secondary | ICD-10-CM

## 2015-08-28 DIAGNOSIS — J3089 Other allergic rhinitis: Secondary | ICD-10-CM

## 2015-08-28 LAB — POCT URINALYSIS DIPSTICK
BILIRUBIN UA: NEGATIVE
Glucose, UA: NEGATIVE
KETONES UA: NEGATIVE
LEUKOCYTES UA: NEGATIVE
Nitrite, UA: NEGATIVE
Protein, UA: NEGATIVE
RBC UA: NEGATIVE
Urobilinogen, UA: NEGATIVE
pH, UA: 8

## 2015-08-28 MED ORDER — FLUTICASONE PROPIONATE 50 MCG/ACT NA SUSP: 2.0000 | Freq: Every day | NASAL | 99 refills | Status: DC

## 2015-08-28 NOTE — Progress Notes (Signed)
Patient reports the swelling she has gets better at night when she lays down

## 2015-08-28 NOTE — Progress Notes (Signed)
Subjective:    Jordan Holt is being seen today for her obstetrical visit.  She is at [redacted]w[redacted]d gestation.  Patient reports backache, no bleeding, no contractions, no cramping, no leaking and is using vaginal progesterone, denies any sexual intercourse, states that her feet started swelling this week, has congestion/cold symtpoms, denies any fever.   Fetal Movement: normal.   Problem List Items Addressed This Visit    None    Visit Diagnoses    Prenatal care, third trimester    -  Primary   Relevant Orders   POCT urinalysis dipstick (Completed)   POCT urinalysis dipstick   Environmental and seasonal allergies       Relevant Medications   fluticasone (FLONASE) 50 MCG/ACT nasal spray     Patient Active Problem List   Diagnosis Date Noted  . Twin pregnancy 04/09/2015  . Normal delivery 12/19/2012  . PIH (pregnancy induced hypertension) 12/13/2012  . Iron (Fe) deficiency anemia 08/07/2012  . Supervision of normal first pregnancy 07/30/2012  . Menorrhagia 03/14/2012  . DUB (dysfunctional uterine bleeding) 03/09/2012   Objective:    BP 118/71   Pulse (!) 102   Temp 98.3 F (36.8 C)   Wt 170 lb 12.8 oz (77.5 kg)   LMP 01/28/2015   BMI 27.57 kg/m  FHT:  Baby A: 142 BPM;  Baby B:  153 BPM  Uterine Size: 37 cm, size greater than dates and consistent with twins     Assessment:    Pregnancy @ [redacted]w[redacted]d weeks Twins, dichorionic, diamniotic (cephalic/Cephalic).   URI symptoms  Plan:    Work letter for reduced hours: Opal Sidles notified.    Ultrasound planned.    Signs and symptoms of preterm labor: discussed.. Pediatric planning: choices discussed. Maternity leave: discussed, forms done.. Signs of premature labor and dilation were reviewed.   Discussed fetal positions and related modes of delivery.  Consent form for twins provided. Follow up: 2 weeks.

## 2015-09-11 ENCOUNTER — Ambulatory Visit (INDEPENDENT_AMBULATORY_CARE_PROVIDER_SITE_OTHER): Payer: Medicaid Other | Admitting: Obstetrics

## 2015-09-11 VITALS — BP 136/84 | HR 98 | Temp 98.5°F | Wt 172.0 lb

## 2015-09-11 DIAGNOSIS — Z3493 Encounter for supervision of normal pregnancy, unspecified, third trimester: Secondary | ICD-10-CM

## 2015-09-11 LAB — POCT URINALYSIS DIPSTICK
BILIRUBIN UA: NEGATIVE
Blood, UA: NEGATIVE
GLUCOSE UA: NEGATIVE
Ketones, UA: NEGATIVE
Leukocytes, UA: NEGATIVE
NITRITE UA: NEGATIVE
Protein, UA: NEGATIVE
Spec Grav, UA: <=1.005
Urobilinogen, UA: 0.2
pH, UA: 8

## 2015-09-11 NOTE — Progress Notes (Signed)
Pt denies concerns today 

## 2015-09-17 ENCOUNTER — Ambulatory Visit (HOSPITAL_COMMUNITY)
Admission: RE | Admit: 2015-09-17 | Discharge: 2015-09-17 | Disposition: A | Discharge status: Home / Self Care | Payer: Medicaid Other | Source: Ambulatory Visit | Attending: Certified Nurse Midwife | Admitting: Certified Nurse Midwife

## 2015-09-17 ENCOUNTER — Encounter (HOSPITAL_COMMUNITY): Payer: Self-pay

## 2015-09-17 DIAGNOSIS — O30043 Twin pregnancy, dichorionic/diamniotic, third trimester: Secondary | ICD-10-CM | POA: Insufficient documentation

## 2015-09-17 DIAGNOSIS — O09813 Supervision of pregnancy resulting from assisted reproductive technology, third trimester: Secondary | ICD-10-CM | POA: Insufficient documentation

## 2015-09-17 DIAGNOSIS — O26873 Cervical shortening, third trimester: Secondary | ICD-10-CM | POA: Diagnosis not present

## 2015-09-17 DIAGNOSIS — O09293 Supervision of pregnancy with other poor reproductive or obstetric history, third trimester: Secondary | ICD-10-CM | POA: Diagnosis not present

## 2015-09-17 DIAGNOSIS — Z3A33 33 weeks gestation of pregnancy: Secondary | ICD-10-CM | POA: Insufficient documentation

## 2015-09-18 ENCOUNTER — Other Ambulatory Visit (HOSPITAL_COMMUNITY): Payer: Self-pay | Admitting: *Deleted

## 2015-09-18 DIAGNOSIS — O30049 Twin pregnancy, dichorionic/diamniotic, unspecified trimester: Secondary | ICD-10-CM

## 2015-09-23 ENCOUNTER — Encounter: Payer: Self-pay | Admitting: Advanced Practice Midwife

## 2015-09-23 DIAGNOSIS — O99019 Anemia complicating pregnancy, unspecified trimester: Secondary | ICD-10-CM

## 2015-09-23 DIAGNOSIS — D509 Iron deficiency anemia, unspecified: Secondary | ICD-10-CM | POA: Insufficient documentation

## 2015-09-24 ENCOUNTER — Ambulatory Visit (INDEPENDENT_AMBULATORY_CARE_PROVIDER_SITE_OTHER): Payer: Medicaid Other | Admitting: Advanced Practice Midwife

## 2015-09-24 VITALS — BP 126/85 | HR 94 | Temp 98.2°F | Wt 183.0 lb

## 2015-09-24 DIAGNOSIS — O1203 Gestational edema, third trimester: Secondary | ICD-10-CM | POA: Diagnosis not present

## 2015-09-24 DIAGNOSIS — O30043 Twin pregnancy, dichorionic/diamniotic, third trimester: Secondary | ICD-10-CM | POA: Diagnosis not present

## 2015-09-24 MED ORDER — T.E.D. BELOW KNEE/M-REGULAR MISC: 1.0000 | Freq: Every day | 1 refills | Status: DC

## 2015-09-24 NOTE — Progress Notes (Signed)
   PRENATAL VISIT NOTE  Subjective:  Jordan Holt is a 30 y.o. G2P1001 at [redacted]w[redacted]d being seen today for ongoing prenatal care.  She is currently monitored for the following issues for this high-risk pregnancy and has Twin pregnancy in third trimester; Iron (Fe) deficiency anemia; Normal delivery; Twin pregnancy; Menorrhagia; and Iron deficiency anemia of pregnancy on her problem list.  Patient reports swelling of feet and ankles.  Contractions: Not present. Vag. Bleeding: None.  Movement: Present. Denies leaking of fluid.   The following portions of the patient's history were reviewed and updated as appropriate: allergies, current medications, past family history, past medical history, past social history, past surgical history and problem list. Problem list updated.  Objective:   Vitals:   09/24/15 1425  BP: 126/85  Pulse: 94  Temp: 98.2 F (36.8 C)  Weight: 183 lb (83 kg)    Fetal Status: Fetal Heart Rate (bpm): 142/137 Fundal Height: 43 cm Movement: Present     General:  Alert, oriented and cooperative. Patient is in no acute distress.  Skin: Skin is warm and dry. No rash noted.   Cardiovascular: Normal heart rate noted  Respiratory: Normal respiratory effort, no problems with respiration noted  Abdomen: Soft, gravid, appropriate for gestational age. Pain/Pressure: Present     Pelvic:  Cervical exam deferred        Extremities: Normal range of motion.     Mental Status: Normal mood and affect. Normal behavior. Normal judgment and thought content.   Urinalysis:      Assessment and Plan:  Pregnancy: G2P1001 at [redacted]w[redacted]d  1. Dichorionic diamniotic twin pregnancy in third trimester --Pt worried b/c baby A is now breech.  Recommend walking, gentle stretching, positions like hands and knees if comfortable to encourage fetal rotation/position change.  Will check again if laboring, or prior to IOL at 38 weeks.  2. Edema during pregnancy, third trimester --BP wnl, no s/sx of  preeclampsia.  Discussed need for elevation when sitting, Rx for TED hose printed for pt.  Reviewed warning signs/reasons to come to hospital.  Preterm labor symptoms and general obstetric precautions including but not limited to vaginal bleeding, contractions, leaking of fluid and fetal movement were reviewed in detail with the patient. Please refer to After Visit Summary for other counseling recommendations.  Return in about 2 weeks (around 10/08/2015).  Elvera Maria, CNM

## 2015-09-24 NOTE — Patient Instructions (Signed)
[place]  No reviews  [facility] Store  [address]  [phone]  Open until 6:00 PM    [place]  No reviews  [facility] Store  [id number]  [phone]  [facility]   [phone]  [facility] Store  [address]  [phone]  Open until 5:30 PM  Third Trimester of Pregnancy The third trimester is from week 29 through week 42, months 7 through 9. The third trimester is a time when the fetus is growing rapidly. At the end of the ninth month, the fetus is about 20 inches in length and weighs 6-10 pounds.  BODY CHANGES Your body goes through many changes during pregnancy. The changes vary from woman to woman.   Your weight will continue to increase. You can expect to gain 25-35 pounds (11-16 kg) by the end of the pregnancy.  You may begin to get stretch marks on your hips, abdomen, and breasts.  You may urinate more often because the fetus is moving lower into your pelvis and pressing on your bladder.  You may develop or continue to have heartburn as a result of your pregnancy.  You may develop constipation because certain hormones are causing the muscles that push waste through your intestines to slow down.  You may develop hemorrhoids or swollen, bulging veins (varicose veins).  You may have pelvic pain because of the weight gain and pregnancy hormones relaxing your joints between the bones in your pelvis. Backaches may result from overexertion of the muscles supporting your posture.  You may have changes in your hair. These can include thickening of your hair, rapid growth, and changes in texture. Some women also have hair loss during or after pregnancy, or hair that feels dry or thin. Your hair will most likely return to normal after your baby is born.  Your breasts will continue to grow and be tender. A yellow discharge may leak from your breasts called colostrum.  Your belly button may stick  out.  You may feel short of breath because of your expanding uterus.  You may notice the fetus "dropping," or moving lower in your abdomen.  You may have a bloody mucus discharge. This usually occurs a few days to a week before labor begins.  Your cervix becomes thin and soft (effaced) near your due date. WHAT TO EXPECT AT YOUR PRENATAL EXAMS  You will have prenatal exams every 2 weeks until week 36. Then, you will have weekly prenatal exams. During a routine prenatal visit:  You will be weighed to make sure you and the fetus are growing normally.  Your blood pressure is taken.  Your abdomen will be measured to track your baby's growth.  The fetal heartbeat will be listened to.  Any test results from the previous visit will be discussed.  You may have a cervical check near your due date to see if you have effaced. At around 36 weeks, your caregiver will check your cervix. At the same time, your caregiver will also perform a test on the secretions of the vaginal tissue. This test is to determine if a type of bacteria, Group B streptococcus, is present. Your caregiver will explain this further. Your caregiver may ask you:  What your birth plan is.  How you are feeling.  If you are feeling the baby move.  If you have had any abnormal symptoms, such as leaking fluid, bleeding, severe headaches, or abdominal  cramping.  If you are using any tobacco products, including cigarettes, chewing tobacco, and electronic cigarettes.  If you have any questions. Other tests or screenings that may be performed during your third trimester include:  Blood tests that check for low iron levels (anemia).  Fetal testing to check the health, activity level, and growth of the fetus. Testing is done if you have certain medical conditions or if there are problems during the pregnancy.  HIV (human immunodeficiency virus) testing. If you are at high risk, you may be screened for HIV during your third  trimester of pregnancy. FALSE LABOR You may feel small, irregular contractions that eventually go away. These are called Braxton Hicks contractions, or false labor. Contractions may last for hours, days, or even weeks before true labor sets in. If contractions come at regular intervals, intensify, or become painful, it is best to be seen by your caregiver.  SIGNS OF LABOR   Menstrual-like cramps.  Contractions that are 5 minutes apart or less.  Contractions that start on the top of the uterus and spread down to the lower abdomen and back.  A sense of increased pelvic pressure or back pain.  A watery or bloody mucus discharge that comes from the vagina. If you have any of these signs before the 37th week of pregnancy, call your caregiver right away. You need to go to the hospital to get checked immediately. HOME CARE INSTRUCTIONS   Avoid all smoking, herbs, alcohol, and unprescribed drugs. These chemicals affect the formation and growth of the baby.  Do not use any tobacco products, including cigarettes, chewing tobacco, and electronic cigarettes. If you need help quitting, ask your health care provider. You may receive counseling support and other resources to help you quit.  Follow your caregiver's instructions regarding medicine use. There are medicines that are either safe or unsafe to take during pregnancy.  Exercise only as directed by your caregiver. Experiencing uterine cramps is a good sign to stop exercising.  Continue to eat regular, healthy meals.  Wear a good support bra for breast tenderness.  Do not use hot tubs, steam rooms, or saunas.  Wear your seat belt at all times when driving.  Avoid raw meat, uncooked cheese, cat litter boxes, and soil used by cats. These carry germs that can cause birth defects in the baby.  Take your prenatal vitamins.  Take 1500-2000 mg of calcium daily starting at the 20th week of pregnancy until you deliver your baby.  Try taking a  stool softener (if your caregiver approves) if you develop constipation. Eat more high-fiber foods, such as fresh vegetables or fruit and whole grains. Drink plenty of fluids to keep your urine clear or pale yellow.  Take warm sitz baths to soothe any pain or discomfort caused by hemorrhoids. Use hemorrhoid cream if your caregiver approves.  If you develop varicose veins, wear support hose. Elevate your feet for 15 minutes, 3-4 times a day. Limit salt in your diet.  Avoid heavy lifting, wear low heal shoes, and practice good posture.  Rest a lot with your legs elevated if you have leg cramps or low back pain.  Visit your dentist if you have not gone during your pregnancy. Use a soft toothbrush to brush your teeth and be gentle when you floss.  A sexual relationship may be continued unless your caregiver directs you otherwise.  Do not travel far distances unless it is absolutely necessary and only with the approval of your caregiver.  Take prenatal  classes to understand, practice, and ask questions about the labor and delivery.  Make a trial run to the hospital.  Pack your hospital bag.  Prepare the baby's nursery.  Continue to go to all your prenatal visits as directed by your caregiver. SEEK MEDICAL CARE IF:  You are unsure if you are in labor or if your water has broken.  You have dizziness.  You have mild pelvic cramps, pelvic pressure, or nagging pain in your abdominal area.  You have persistent nausea, vomiting, or diarrhea.  You have a bad smelling vaginal discharge.  You have pain with urination. SEEK IMMEDIATE MEDICAL CARE IF:   You have a fever.  You are leaking fluid from your vagina.  You have spotting or bleeding from your vagina.  You have severe abdominal cramping or pain.  You have rapid weight loss or gain.  You have shortness of breath with chest pain.  You notice sudden or extreme swelling of your face, hands, ankles, feet, or legs.  You have  not felt your baby move in over an hour.  You have severe headaches that do not go away with medicine.  You have vision changes.   This information is not intended to replace advice given to you by your health care provider. Make sure you discuss any questions you have with your health care provider.   Document Released: 12/21/2000 Document Revised: 01/17/2014 Document Reviewed: 02/28/2012 Elsevier Interactive Patient Education [employer].

## 2015-10-08 ENCOUNTER — Encounter (HOSPITAL_COMMUNITY): Payer: Self-pay | Admitting: Anesthesiology

## 2015-10-08 ENCOUNTER — Encounter (HOSPITAL_COMMUNITY)
Admission: AD | Disposition: A | Discharge status: Home / Self Care | Payer: Self-pay | Source: Ambulatory Visit | Attending: Obstetrics & Gynecology

## 2015-10-08 ENCOUNTER — Inpatient Hospital Stay (HOSPITAL_COMMUNITY): Payer: Medicaid Other | Admitting: Anesthesiology

## 2015-10-08 ENCOUNTER — Inpatient Hospital Stay (HOSPITAL_COMMUNITY)
Admission: AD | Admit: 2015-10-08 | Discharge: 2015-10-12 | DRG: 765 | Disposition: A | Discharge status: Home / Self Care | Payer: Medicaid Other | Source: Ambulatory Visit | Attending: Obstetrics & Gynecology | Admitting: Obstetrics & Gynecology

## 2015-10-08 DIAGNOSIS — O1414 Severe pre-eclampsia complicating childbirth: Secondary | ICD-10-CM | POA: Diagnosis present

## 2015-10-08 DIAGNOSIS — O9902 Anemia complicating childbirth: Secondary | ICD-10-CM | POA: Diagnosis not present

## 2015-10-08 DIAGNOSIS — O328XX1 Maternal care for other malpresentation of fetus, fetus 1: Secondary | ICD-10-CM | POA: Diagnosis present

## 2015-10-08 DIAGNOSIS — O30003 Twin pregnancy, unspecified number of placenta and unspecified number of amniotic sacs, third trimester: Secondary | ICD-10-CM | POA: Diagnosis present

## 2015-10-08 DIAGNOSIS — Z3A36 36 weeks gestation of pregnancy: Secondary | ICD-10-CM

## 2015-10-08 DIAGNOSIS — D509 Iron deficiency anemia, unspecified: Secondary | ICD-10-CM | POA: Diagnosis present

## 2015-10-08 DIAGNOSIS — O328XX2 Maternal care for other malpresentation of fetus, fetus 2: Secondary | ICD-10-CM | POA: Diagnosis present

## 2015-10-08 DIAGNOSIS — O30043 Twin pregnancy, dichorionic/diamniotic, third trimester: Secondary | ICD-10-CM | POA: Diagnosis present

## 2015-10-08 DIAGNOSIS — O34219 Maternal care for unspecified type scar from previous cesarean delivery: Secondary | ICD-10-CM

## 2015-10-08 DIAGNOSIS — O321XX0 Maternal care for breech presentation, not applicable or unspecified: Secondary | ICD-10-CM | POA: Diagnosis present

## 2015-10-08 DIAGNOSIS — Z98891 History of uterine scar from previous surgery: Secondary | ICD-10-CM

## 2015-10-08 LAB — CBC
HCT: 30.1 % — ABNORMAL LOW (ref 36.0–46.0)
HEMATOCRIT: 26.2 % — AB (ref 36.0–46.0)
HEMOGLOBIN: 7.8 g/dL — AB (ref 12.0–15.0)
Hemoglobin: 9.9 g/dL — ABNORMAL LOW (ref 12.0–15.0)
MCH: 17.9 pg — AB (ref 26.0–34.0)
MCH: 22 pg — ABNORMAL LOW (ref 26.0–34.0)
MCHC: 29.8 g/dL — AB (ref 30.0–36.0)
MCHC: 32.9 g/dL (ref 30.0–36.0)
MCV: 60.1 fL — ABNORMAL LOW (ref 78.0–100.0)
MCV: 66.7 fL — ABNORMAL LOW (ref 78.0–100.0)
Platelets: 257 10*3/uL (ref 150–400)
Platelets: 178 10*3/uL (ref 150–400)
RBC: 4.36 MIL/uL (ref 3.87–5.11)
RBC: 4.51 MIL/uL (ref 3.87–5.11)
RDW: 20.7 % — AB (ref 11.5–15.5)
RDW: 25.7 % — AB (ref 11.5–15.5)
WBC: 11.2 10*3/uL — ABNORMAL HIGH (ref 4.0–10.5)
WBC: 14.7 10*3/uL — ABNORMAL HIGH (ref 4.0–10.5)

## 2015-10-08 LAB — COMPREHENSIVE METABOLIC PANEL
ALBUMIN: 1.9 g/dL — AB (ref 3.5–5.0)
ALK PHOS: 200 U/L — AB (ref 38–126)
ALT: 22 U/L (ref 14–54)
AST: 35 U/L (ref 15–41)
Anion gap: 7 (ref 5–15)
BILIRUBIN TOTAL: 1.2 mg/dL (ref 0.3–1.2)
CALCIUM: 8.5 mg/dL — AB (ref 8.9–10.3)
CO2: 21 mmol/L — ABNORMAL LOW (ref 22–32)
CREATININE: 0.71 mg/dL (ref 0.44–1.00)
Chloride: 110 mmol/L (ref 101–111)
GFR calc Af Amer: >60 mL/min (ref >60)
GFR calc non Af Amer: >60 mL/min (ref >60)
GLUCOSE: 100 mg/dL — AB (ref 65–99)
Potassium: 3.5 mmol/L (ref 3.5–5.1)
Sodium: 138 mmol/L (ref 135–145)
TOTAL PROTEIN: 4.7 g/dL — AB (ref 6.5–8.1)

## 2015-10-08 LAB — PREPARE RBC (CROSSMATCH)

## 2015-10-08 SURGERY — Surgical Case
Anesthesia: Spinal

## 2015-10-08 MED ORDER — OXYTOCIN 10 UNIT/ML IJ SOLN
INTRAVENOUS | Status: DC | PRN
  Administered 2015-10-08: 40 [IU] via INTRAVENOUS

## 2015-10-08 MED ORDER — NALBUPHINE HCL 10 MG/ML IJ SOLN: 5.0000 mg | Freq: Once | INTRAMUSCULAR | Status: DC | PRN

## 2015-10-08 MED ORDER — OXYCODONE-ACETAMINOPHEN 5-325 MG PO TABS: 2.0000 | ORAL_TABLET | ORAL | Status: DC | PRN

## 2015-10-08 MED ORDER — WITCH HAZEL-GLYCERIN EX PADS
1.0000 | MEDICATED_PAD | CUTANEOUS | Status: DC | PRN
Start: 2015-10-08 — End: 2015-10-12

## 2015-10-08 MED ORDER — TETANUS-DIPHTH-ACELL PERTUSSIS 5-2.5-18.5 LF-MCG/0.5 IM SUSP: 0.5000 mL | Freq: Once | INTRAMUSCULAR | Status: DC

## 2015-10-08 MED ORDER — NALBUPHINE HCL 10 MG/ML IJ SOLN: 5.0000 mg | INTRAMUSCULAR | Status: DC | PRN

## 2015-10-08 MED ORDER — OXYCODONE-ACETAMINOPHEN 5-325 MG PO TABS
1.0000 | ORAL_TABLET | ORAL | Status: DC | PRN
  Administered 2015-10-09 – 2015-10-11 (×3): 1 via ORAL
  Filled 2015-10-08 (×2): qty 1

## 2015-10-08 MED ORDER — ZOLPIDEM TARTRATE 5 MG PO TABS: 5.0000 mg | ORAL_TABLET | Freq: Every evening | ORAL | Status: DC | PRN

## 2015-10-08 MED ORDER — IBUPROFEN 600 MG PO TABS
600.0000 mg | ORAL_TABLET | Freq: Four times a day (QID) | ORAL | Status: DC
  Administered 2015-10-09 – 2015-10-12 (×13): 600 mg via ORAL
  Filled 2015-10-08 (×13): qty 1

## 2015-10-08 MED ORDER — FAMOTIDINE IN NACL 20-0.9 MG/50ML-% IV SOLN
INTRAVENOUS | Status: AC
  Administered 2015-10-08: 20 mg via INTRAVENOUS
  Filled 2015-10-08: qty 50

## 2015-10-08 MED ORDER — ONDANSETRON HCL 4 MG/2ML IJ SOLN
INTRAMUSCULAR | Status: DC | PRN
  Administered 2015-10-08: 4 mg via INTRAVENOUS

## 2015-10-08 MED ORDER — DIBUCAINE 1 % RE OINT: 1.0000 "application " | TOPICAL_OINTMENT | RECTAL | Status: DC | PRN

## 2015-10-08 MED ORDER — ONDANSETRON HCL 4 MG/2ML IJ SOLN: 4.0000 mg | Freq: Once | INTRAMUSCULAR | Status: DC | PRN

## 2015-10-08 MED ORDER — ACETAMINOPHEN 500 MG PO TABS
1000.0000 mg | ORAL_TABLET | Freq: Four times a day (QID) | ORAL | Status: AC
  Administered 2015-10-09 (×3): 1000 mg via ORAL
  Filled 2015-10-08 (×3): qty 2

## 2015-10-08 MED ORDER — SIMETHICONE 80 MG PO CHEW: 80.0000 mg | CHEWABLE_TABLET | ORAL | Status: DC | PRN

## 2015-10-08 MED ORDER — OXYCODONE HCL 5 MG PO TABS
5.0000 mg | ORAL_TABLET | ORAL | Status: DC | PRN
  Administered 2015-10-09: 5 mg via ORAL
  Filled 2015-10-08 (×2): qty 1

## 2015-10-08 MED ORDER — OXYTOCIN 40 UNITS IN LACTATED RINGERS INFUSION - SIMPLE MED: 2.5000 [IU]/h | INTRAVENOUS | Status: AC

## 2015-10-08 MED ORDER — SCOPOLAMINE 1 MG/3DAYS TD PT72: 1.0000 | MEDICATED_PATCH | Freq: Once | TRANSDERMAL | Status: DC

## 2015-10-08 MED ORDER — MORPHINE SULFATE (PF) 0.5 MG/ML IJ SOLN
INTRAMUSCULAR | Status: DC | PRN
  Administered 2015-10-08: .2 mg via EPIDURAL

## 2015-10-08 MED ORDER — SODIUM CHLORIDE 0.9 % IJ SOLN
INTRAMUSCULAR | Status: AC
  Filled 2015-10-08: qty 10

## 2015-10-08 MED ORDER — MENTHOL 3 MG MT LOZG: 1.0000 | LOZENGE | OROMUCOSAL | Status: DC | PRN

## 2015-10-08 MED ORDER — CEFAZOLIN SODIUM-DEXTROSE 2-4 GM/100ML-% IV SOLN
2.0000 g | Freq: Once | INTRAVENOUS | Status: AC
  Administered 2015-10-08: 2 g via INTRAVENOUS

## 2015-10-08 MED ORDER — LACTATED RINGERS IV SOLN: INTRAVENOUS | Status: DC

## 2015-10-08 MED ORDER — ACETAMINOPHEN 325 MG PO TABS: 650.0000 mg | ORAL_TABLET | ORAL | Status: DC | PRN

## 2015-10-08 MED ORDER — OXYCODONE HCL 5 MG PO TABS
10.0000 mg | ORAL_TABLET | ORAL | Status: DC | PRN
  Administered 2015-10-09 – 2015-10-10 (×3): 10 mg via ORAL
  Filled 2015-10-08 (×3): qty 2

## 2015-10-08 MED ORDER — SOD CITRATE-CITRIC ACID 500-334 MG/5ML PO SOLN
30.0000 mL | Freq: Once | ORAL | Status: AC
  Administered 2015-10-08: 30 mL via ORAL

## 2015-10-08 MED ORDER — SODIUM CHLORIDE 0.9% FLUSH: 3.0000 mL | INTRAVENOUS | Status: DC | PRN

## 2015-10-08 MED ORDER — BUPIVACAINE HCL (PF) 0.5 % IJ SOLN
INTRAMUSCULAR | Status: AC
  Filled 2015-10-08: qty 30

## 2015-10-08 MED ORDER — BUPIVACAINE IN DEXTROSE 0.75-8.25 % IT SOLN
INTRATHECAL | Status: DC | PRN
  Administered 2015-10-08: 1.6 mL via INTRATHECAL

## 2015-10-08 MED ORDER — DIPHENHYDRAMINE HCL 50 MG/ML IJ SOLN: 12.5000 mg | INTRAMUSCULAR | Status: DC | PRN

## 2015-10-08 MED ORDER — SODIUM CHLORIDE 0.9 % IV SOLN: Freq: Once | INTRAVENOUS | Status: DC

## 2015-10-08 MED ORDER — LIDOCAINE HCL (PF) 1 % IJ SOLN
INTRAMUSCULAR | Status: AC
  Filled 2015-10-08: qty 5

## 2015-10-08 MED ORDER — TERBUTALINE SULFATE 1 MG/ML IJ SOLN
INTRAMUSCULAR | Status: AC
  Administered 2015-10-08: 1 mg
  Filled 2015-10-08: qty 1

## 2015-10-08 MED ORDER — MEPERIDINE HCL 25 MG/ML IJ SOLN
INTRAMUSCULAR | Status: AC
  Filled 2015-10-08: qty 1

## 2015-10-08 MED ORDER — AMLODIPINE BESYLATE 10 MG PO TABS
10.0000 mg | ORAL_TABLET | Freq: Every day | ORAL | Status: DC
  Administered 2015-10-08 – 2015-10-12 (×5): 10 mg via ORAL
  Filled 2015-10-08 (×6): qty 1

## 2015-10-08 MED ORDER — SODIUM CHLORIDE 0.9 % IR SOLN
Status: DC | PRN
  Administered 2015-10-08: 1

## 2015-10-08 MED ORDER — MEPERIDINE HCL 25 MG/ML IJ SOLN
INTRAMUSCULAR | Status: DC | PRN
Start: 2015-10-08 — End: 2015-10-08
  Administered 2015-10-08 (×2): 12.5 mg via INTRAVENOUS

## 2015-10-08 MED ORDER — TERBUTALINE SULFATE 1 MG/ML IJ SOLN: 0.2500 mg | Freq: Once | INTRAMUSCULAR | Status: DC

## 2015-10-08 MED ORDER — LACTATED RINGERS IV BOLUS (SEPSIS)
1000.0000 mL | Freq: Once | INTRAVENOUS | Status: AC
  Administered 2015-10-08: 1000 mL via INTRAVENOUS

## 2015-10-08 MED ORDER — MORPHINE SULFATE-NACL 0.5-0.9 MG/ML-% IV SOSY
PREFILLED_SYRINGE | INTRAVENOUS | Status: AC
  Filled 2015-10-08: qty 1

## 2015-10-08 MED ORDER — IBUPROFEN 600 MG PO TABS: 600.0000 mg | ORAL_TABLET | Freq: Four times a day (QID) | ORAL | Status: DC

## 2015-10-08 MED ORDER — MAGNESIUM HYDROXIDE 400 MG/5ML PO SUSP: 30.0000 mL | ORAL | Status: DC | PRN

## 2015-10-08 MED ORDER — MAGNESIUM SULFATE 50 % IJ SOLN: 2.0000 g/h | INTRAVENOUS | Status: DC

## 2015-10-08 MED ORDER — KETOROLAC TROMETHAMINE 30 MG/ML IJ SOLN: 30.0000 mg | Freq: Four times a day (QID) | INTRAMUSCULAR | Status: DC | PRN

## 2015-10-08 MED ORDER — SENNOSIDES-DOCUSATE SODIUM 8.6-50 MG PO TABS
2.0000 | ORAL_TABLET | ORAL | Status: DC
  Administered 2015-10-09 – 2015-10-11 (×4): 2 via ORAL
  Filled 2015-10-08 (×4): qty 2

## 2015-10-08 MED ORDER — HYDRALAZINE HCL 20 MG/ML IJ SOLN
10.0000 mg | Freq: Once | INTRAMUSCULAR | Status: AC | PRN
  Administered 2015-10-08: 10 mg via INTRAVENOUS
  Filled 2015-10-08: qty 1

## 2015-10-08 MED ORDER — MAGNESIUM SULFATE 50 % IJ SOLN
2.0000 g/h | INTRAVENOUS | Status: AC
  Administered 2015-10-09: 2 g/h via INTRAVENOUS
  Filled 2015-10-08 (×2): qty 80

## 2015-10-08 MED ORDER — SIMETHICONE 80 MG PO CHEW: 80.0000 mg | CHEWABLE_TABLET | ORAL | Status: DC

## 2015-10-08 MED ORDER — MAGNESIUM SULFATE 4 GM/100ML IV SOLN: 4.0000 g | Freq: Once | INTRAVENOUS | Status: DC

## 2015-10-08 MED ORDER — DIPHENHYDRAMINE HCL 25 MG PO CAPS: 25.0000 mg | ORAL_CAPSULE | Freq: Four times a day (QID) | ORAL | Status: DC | PRN

## 2015-10-08 MED ORDER — WITCH HAZEL-GLYCERIN EX PADS: 1.0000 "application " | MEDICATED_PAD | CUTANEOUS | Status: DC | PRN

## 2015-10-08 MED ORDER — COCONUT OIL OIL
1.0000 "application " | TOPICAL_OIL | Status: DC | PRN
  Administered 2015-10-11: 1 via TOPICAL
  Filled 2015-10-08: qty 120

## 2015-10-08 MED ORDER — PRENATAL MULTIVITAMIN CH: 1.0000 | ORAL_TABLET | Freq: Every day | ORAL | Status: DC

## 2015-10-08 MED ORDER — LACTATED RINGERS IV SOLN
INTRAVENOUS | Status: DC | PRN
  Administered 2015-10-08 (×3): via INTRAVENOUS

## 2015-10-08 MED ORDER — FENTANYL CITRATE (PF) 100 MCG/2ML IJ SOLN
INTRAMUSCULAR | Status: DC | PRN
  Administered 2015-10-08: 20 ug via INTRAVENOUS

## 2015-10-08 MED ORDER — FENTANYL CITRATE (PF) 100 MCG/2ML IJ SOLN
25.0000 ug | INTRAMUSCULAR | Status: DC | PRN
  Administered 2015-10-08 (×2): 50 ug via INTRAVENOUS

## 2015-10-08 MED ORDER — KETOROLAC TROMETHAMINE 30 MG/ML IJ SOLN
INTRAMUSCULAR | Status: AC
  Administered 2015-10-08: 30 mg via INTRAMUSCULAR
  Filled 2015-10-08: qty 1

## 2015-10-08 MED ORDER — LACTATED RINGERS IV SOLN
INTRAVENOUS | Status: AC
  Administered 2015-10-08 – 2015-10-09 (×2): via INTRAVENOUS

## 2015-10-08 MED ORDER — DEXTROSE 5 % IV SOLN
1.0000 ug/kg/h | INTRAVENOUS | Status: DC | PRN
Start: 2015-10-08 — End: 2015-10-12

## 2015-10-08 MED ORDER — FENTANYL CITRATE (PF) 100 MCG/2ML IJ SOLN
INTRAMUSCULAR | Status: AC
  Filled 2015-10-08: qty 2

## 2015-10-08 MED ORDER — LIDOCAINE-EPINEPHRINE (PF) 2 %-1:200000 IJ SOLN
INTRAMUSCULAR | Status: AC
  Filled 2015-10-08: qty 20

## 2015-10-08 MED ORDER — ONDANSETRON HCL 4 MG/2ML IJ SOLN: 4.0000 mg | Freq: Three times a day (TID) | INTRAMUSCULAR | Status: DC | PRN

## 2015-10-08 MED ORDER — MEASLES, MUMPS & RUBELLA VAC ~~LOC~~ INJ: 0.5000 mL | INJECTION | Freq: Once | SUBCUTANEOUS | Status: DC

## 2015-10-08 MED ORDER — COCONUT OIL OIL: 1.0000 "application " | TOPICAL_OIL | Status: DC | PRN

## 2015-10-08 MED ORDER — DIPHENHYDRAMINE HCL 25 MG PO CAPS: 25.0000 mg | ORAL_CAPSULE | ORAL | Status: DC | PRN

## 2015-10-08 MED ORDER — SIMETHICONE 80 MG PO CHEW
80.0000 mg | CHEWABLE_TABLET | ORAL | Status: DC
  Administered 2015-10-09 – 2015-10-12 (×4): 80 mg via ORAL
  Filled 2015-10-08 (×4): qty 1

## 2015-10-08 MED ORDER — MEPERIDINE HCL 25 MG/ML IJ SOLN: 6.2500 mg | INTRAMUSCULAR | Status: DC | PRN

## 2015-10-08 MED ORDER — MAGNESIUM SULFATE BOLUS VIA INFUSION
4.0000 g | Freq: Once | INTRAVENOUS | Status: AC
  Administered 2015-10-08: 4 g via INTRAVENOUS
  Filled 2015-10-08: qty 500

## 2015-10-08 MED ORDER — PHENYLEPHRINE 8 MG IN D5W 100 ML (0.08MG/ML) PREMIX OPTIME
INJECTION | INTRAVENOUS | Status: DC | PRN
  Administered 2015-10-08: 20 ug/min via INTRAVENOUS

## 2015-10-08 MED ORDER — FAMOTIDINE IN NACL 20-0.9 MG/50ML-% IV SOLN
20.0000 mg | Freq: Once | INTRAVENOUS | Status: AC
  Administered 2015-10-08: 20 mg via INTRAVENOUS

## 2015-10-08 MED ORDER — NALOXONE HCL 0.4 MG/ML IJ SOLN: 0.4000 mg | INTRAMUSCULAR | Status: DC | PRN

## 2015-10-08 MED ORDER — KETOROLAC TROMETHAMINE 30 MG/ML IJ SOLN
30.0000 mg | Freq: Four times a day (QID) | INTRAMUSCULAR | Status: DC | PRN
  Administered 2015-10-08: 30 mg via INTRAMUSCULAR

## 2015-10-08 MED ORDER — FERROUS SULFATE 325 (65 FE) MG PO TABS
325.0000 mg | ORAL_TABLET | Freq: Two times a day (BID) | ORAL | Status: DC
  Administered 2015-10-09: 325 mg via ORAL
  Filled 2015-10-08: qty 1

## 2015-10-08 MED ORDER — BUPIVACAINE HCL (PF) 0.5 % IJ SOLN
INTRAMUSCULAR | Status: DC | PRN
Start: 2015-10-08 — End: 2015-10-08
  Administered 2015-10-08: 30 mL

## 2015-10-08 MED ORDER — LABETALOL HCL 5 MG/ML IV SOLN
20.0000 mg | INTRAVENOUS | Status: DC | PRN
  Administered 2015-10-08: 20 mg via INTRAVENOUS
  Filled 2015-10-08: qty 4

## 2015-10-08 MED ORDER — SENNOSIDES-DOCUSATE SODIUM 8.6-50 MG PO TABS: 2.0000 | ORAL_TABLET | ORAL | Status: DC

## 2015-10-08 MED ORDER — PRENATAL MULTIVITAMIN CH
1.0000 | ORAL_TABLET | Freq: Every day | ORAL | Status: DC
  Administered 2015-10-09 – 2015-10-11 (×3): 1 via ORAL
  Filled 2015-10-08 (×3): qty 1

## 2015-10-08 MED ORDER — SOD CITRATE-CITRIC ACID 500-334 MG/5ML PO SOLN
ORAL | Status: AC
  Filled 2015-10-08: qty 15

## 2015-10-08 MED ORDER — SIMETHICONE 80 MG PO CHEW
80.0000 mg | CHEWABLE_TABLET | Freq: Three times a day (TID) | ORAL | Status: DC
  Administered 2015-10-09 – 2015-10-12 (×8): 80 mg via ORAL
  Filled 2015-10-08 (×9): qty 1

## 2015-10-08 SURGICAL SUPPLY — 32 items
BENZOIN TINCTURE PRP APPL 2/3 (GAUZE/BANDAGES/DRESSINGS) ×4 IMPLANT
CHLORAPREP W/TINT 26ML (MISCELLANEOUS) ×4 IMPLANT
CLAMP CORD UMBIL (MISCELLANEOUS) IMPLANT
CLOSURE STERI STRIP 1/2 X4 (GAUZE/BANDAGES/DRESSINGS) ×4 IMPLANT
CLOTH BEACON ORANGE TIMEOUT ST (SAFETY) ×4 IMPLANT
DECANTER SPIKE VIAL GLASS SM (MISCELLANEOUS) ×4 IMPLANT
DRSG OPSITE POSTOP 4X10 (GAUZE/BANDAGES/DRESSINGS) ×4 IMPLANT
ELECT REM PT RETURN 9FT ADLT (ELECTROSURGICAL) ×4
ELECTRODE REM PT RTRN 9FT ADLT (ELECTROSURGICAL) ×2 IMPLANT
EXTRACTOR VACUUM M CUP 4 TUBE (SUCTIONS) IMPLANT
EXTRACTOR VACUUM M CUP 4' TUBE (SUCTIONS)
GLOVE BIOGEL PI IND STRL 7.0 (GLOVE) ×6 IMPLANT
GLOVE BIOGEL PI INDICATOR 7.0 (GLOVE) ×6
GLOVE ECLIPSE 7.0 STRL STRAW (GLOVE) ×4 IMPLANT
GOWN STRL REUS W/TWL LRG LVL3 (GOWN DISPOSABLE) ×8 IMPLANT
KIT ABG SYR 3ML LUER SLIP (SYRINGE) IMPLANT
NEEDLE HYPO 22GX1.5 SAFETY (NEEDLE) ×4 IMPLANT
NEEDLE HYPO 25X5/8 SAFETYGLIDE (NEEDLE) ×4 IMPLANT
NS IRRIG 1000ML POUR BTL (IV SOLUTION) ×4 IMPLANT
PACK C SECTION WH (CUSTOM PROCEDURE TRAY) ×4 IMPLANT
PAD ABD 7.5X8 STRL (GAUZE/BANDAGES/DRESSINGS) ×8 IMPLANT
PAD OB MATERNITY 4.3X12.25 (PERSONAL CARE ITEMS) ×4 IMPLANT
PENCIL SMOKE EVAC W/HOLSTER (ELECTROSURGICAL) ×4 IMPLANT
RTRCTR C-SECT PINK 25CM LRG (MISCELLANEOUS) IMPLANT
SUT PDS AB 0 CTX 36 PDP370T (SUTURE) ×4 IMPLANT
SUT PLAIN 2 0 XLH (SUTURE) IMPLANT
SUT VIC AB 0 CTX 36 (SUTURE) ×6
SUT VIC AB 0 CTX36XBRD ANBCTRL (SUTURE) ×6 IMPLANT
SUT VIC AB 4-0 KS 27 (SUTURE) ×4 IMPLANT
SYR CONTROL 10ML LL (SYRINGE) ×4 IMPLANT
TOWEL OR 17X24 6PK STRL BLUE (TOWEL DISPOSABLE) ×4 IMPLANT
TRAY FOLEY CATH SILVER 14FR (SET/KITS/TRAYS/PACK) ×4 IMPLANT

## 2015-10-08 NOTE — Transfer of Care (Signed)
Immediate Anesthesia Transfer of Care Note  Patient: Jordan Holt  Procedure(s) Performed: Procedure(s): CESAREAN SECTION (N/A)  Patient Location: PACU  Anesthesia Type:Spinal  Level of Consciousness: awake, alert , oriented and patient cooperative  Airway & Oxygen Therapy: Patient Spontanous Breathing  Post-op Assessment: Report given to RN and Post -op Vital signs reviewed and stable  Post vital signs: Reviewed and stable  Last Vitals:  Vitals:   10/08/15 0950 10/08/15 0955  BP: 161/91 151/95  Pulse: 111 107  Resp:      Last Pain: There were no vitals filed for this visit.       Complications: No apparent anesthesia complications;Receiving 2nd unit PRBC. VSS

## 2015-10-08 NOTE — Anesthesia Postprocedure Evaluation (Signed)
Anesthesia Post Note  Patient: Jordan Holt  Procedure(s) Performed: Procedure(s) (LRB): CESAREAN SECTION (N/A)  Patient location during evaluation: PACU Anesthesia Type: Spinal Level of consciousness: awake and alert and oriented Pain management: pain level controlled Vital Signs Assessment: post-procedure vital signs reviewed and stable Respiratory status: spontaneous breathing, nonlabored ventilation and respiratory function stable Cardiovascular status: blood pressure returned to baseline and stable Postop Assessment: no headache, no backache, spinal receding, patient able to bend at knees and no signs of nausea or vomiting Anesthetic complications: no Comments: Transfused blood due to intraop blood loss and low starting H/H.     Last Vitals:  Vitals:   10/08/15 1245 10/08/15 1300  BP: (!) 146/93 (!) 152/89  Pulse: 83 85  Resp: 15 (!) 27  Temp:      Last Pain:  Vitals:   10/08/15 1230  TempSrc:   PainSc: 7    Pain Goal:                 [name] A.

## 2015-10-08 NOTE — Op Note (Signed)
Jordan Holt PROCEDURE DATE: 10/08/2015  PREOPERATIVE DIAGNOSES: Intrauterine pregnancy at [redacted]w[redacted]d weeks gestation; di/di twins in breech/breech presentation; active preterm labor  POSTOPERATIVE DIAGNOSES: The same  PROCEDURE: Primary Low Transverse Cesarean Section  SURGEON:  Dr. Verita Schneiders  ASSISTANT:  Dr. Jacquiline Doe  ANESTHESIOLOGIST: Dr. Lauretta Grill  INDICATIONS: Jordan Holt is a 30 y.o. [id number] at [redacted]w[redacted]d here for cesarean section secondary to the indications listed under preoperative diagnoses; please see preoperative note for further details.  The risks of cesarean section were discussed with the patient including but were not limited to: bleeding which may require transfusion or reoperation; infection which may require antibiotics; injury to bowel, bladder, ureters or other surrounding organs; injury to the fetus; need for additional procedures including hysterectomy in the event of a life-threatening hemorrhage; placental abnormalities wth subsequent pregnancies, incisional problems, thromboembolic phenomenon and other postoperative/anesthesia complications.   The patient concurred with the proposed plan, giving informed written consent for the procedure.    FINDINGS:  Viable female and female infants, both delivered in double footling presentation.    Apgars and weights are in the delivery summary, both infants were stable after delivery.  Clear amniotic fluid x 2.  Intact placenta x 2, three vessel cord x 2.  Normal uterus, fallopian tubes and ovaries bilaterally.  ANESTHESIA: Spinal TOTAL I/O In: 3370 ml [I.V.:2700 ml; Blood:670 ml (2 units)] Out: 1900 ml [Urine:400 ml; Blood:1500 ml] SPECIMENS: Placenta sent to pathology COMPLICATIONS: Increased blood loss with EBL 1500 ml  PROCEDURE IN DETAIL:  The patient preoperatively received intravenous antibiotics and had sequential compression devices applied to her lower extremities.  She was then taken to the operating room where  spinal anesthesia was administered and was found to be adequate. She was then placed in a dorsal supine position with a leftward tilt, and prepped and draped in a sterile manner.  A foley catheter was placed into her bladder and attached to constant gravity.  After an adequate timeout was performed, a Pfannenstiel skin incision was made with scalpel and carried through to the underlying layer of fascia. The fascia was incised in the midline, and this incision was extended bilaterally using the Mayo scissors.  Kocher clamps were applied to the superior aspect of the fascial incision and the underlying rectus muscles were dissected off bluntly.  A similar process was carried out on the inferior aspect of the fascial incision. The rectus muscles were separated in the midline bluntly and the peritoneum was entered bluntly. Attention was turned to the lower uterine segment where a low transverse hysterotomy was made with a scalpel and extended bilaterally bluntly.  The infants were successfully delivered in breech presentation, both twins had body cords x 2.  The cords were clamped and cut after one minute for each twins, and the infants were handed over to the awaiting neonatology team. Uterine massage was then administered, and the placenta delivered intact with a three-vessel cord. The uterus was then cleared of clots and debris.  The hysterotomy was closed with 0 Vicryl in a running locked fashion, and an imbricating layer was also placed with 0 Vicryl.  The pelvis was cleared of all clot and debris. Hemostasis was confirmed on all surfaces.  The peritoneum was closed with a 2-0 Vicryl running stitch. The fascia was then closed using 0 Vicryl in a running fashion.  The subcutaneous layer was irrigated, and 30 ml of 0.5% Marcaine was injected subcutaneously around the incision.  The skin was closed with a 4-0 Vicryl  subcuticular stitch. The patient tolerated the procedure well. Sponge, lap, instrument and needle  counts were correct x 3.  She was taken to the recovery room in stable condition.    Verita Schneiders, MD, [place] Attending [place], [facility]

## 2015-10-08 NOTE — Anesthesia Postprocedure Evaluation (Signed)
Anesthesia Post Note  Patient: Jordan Holt  Procedure(s) Performed: Procedure(s) (LRB): CESAREAN SECTION (N/A)  Patient location during evaluation: Mother Baby Anesthesia Type: Spinal Level of consciousness: awake Pain management: satisfactory to patient Vital Signs Assessment: post-procedure vital signs reviewed and stable Respiratory status: spontaneous breathing Cardiovascular status: stable Anesthetic complications: no     Last Vitals:  Vitals:   10/08/15 1800 10/08/15 1807  BP: (!) 156/87 (!) 147/77  Pulse: 86 91  Resp: 20 20  Temp:      Last Pain:  Vitals:   10/08/15 1745  TempSrc:   PainSc: 0-No pain   Pain Goal:                 [employer]

## 2015-10-08 NOTE — Progress Notes (Signed)
Patient had sterile dressing change with honeycomb applied. No active bleeding from incision noted. Pressure dressing applied. Patient tolerated well. Meds given as ordered for elevated BP. Explained need to transfer patient for management of elevated BP and patient and husband verbalize understanding.

## 2015-10-08 NOTE — Addendum Note (Signed)
Addendum  created 10/08/15 1813 by Asher Muir, CRNA   Sign clinical note

## 2015-10-08 NOTE — H&P (Signed)
Obstetric Preoperative History and Physical  Jordan Holt is a 30 y.o. G2P1001 with di/di twin IUP at [redacted]w[redacted]d presenting for in active labor. In MAU, she was evaluated and found to have a non-cephalic presenting twin with BBOW.  Patient and her husband were informed of need for urgent cesarean section.  No other acute concerns.   Prenatal Course Source of Care: [facility] Pregnancy complications or risks: Patient Active Problem List   Diagnosis Date Noted  . Iron deficiency anemia of pregnancy 09/23/2015  . Breech presentation, antepartum 04/09/2015  . Iron (Fe) deficiency anemia 08/07/2012  . Twin pregnancy in third trimester 07/30/2012  . Menorrhagia 03/14/2012    Clinic  [place] Prenatal Labs  Dating  LMP Blood type: O/Positive/-- (03/30 1451)   Genetic Screen 1 Screen:    AFP:     Quad:     NIPS: Antibody:Negative (03/30 1451)  Anatomic [place]  Twin gestation, Di/Di Rubella: 2.96 (03/30 1451)  GTT Early:               Third trimester: 75/113/105 RPR: Non Reactive (08/04 1055)   Flu vaccine  Declined HBsAg: Negative (03/30 1451)   TDaP vaccine  Declined HIV: Non Reactive (08/04 1055)   Baby Food  Breast/bottle                                          GBS: (For PCN allergy, check sensitivities)  Contraception  none, hx of PCOS Pap:   Circumcision  [facility] for female   Pediatrician  [place]   Support Person  [facility]     Past Medical History:  Diagnosis Date  . PCOS (polycystic ovarian syndrome)     Past Surgical History:  Procedure Laterality Date  . NO PAST SURGERIES      OB History  Gravida Para Term Preterm AB Living  2 1 1     1   SAB TAB Ectopic Multiple Live Births          1    # Outcome Date GA Lbr Len/2nd Weight Sex Delivery Anes PTL Lv  2 Current           1 Term 12/19/12 [redacted]w[redacted]d 00:21 / 00:38 4 lb 11.7 oz (2.145 kg) M Vag-Spont Local  LIV      Social History   Social History  . Marital status: Married    Spouse name: N/A  . Number of children: N/A  .  Years of education: N/A   Social History Main Topics  . Smoking status: Never Smoker  . Smokeless tobacco: Never Used  . Alcohol use No  . Drug use: No  . Sexual activity: Not Currently    Birth control/ protection: None   Other Topics Concern  . None   Social History Narrative  . None    Family History  Problem Relation Age of Onset  . Diabetes Mother   . Alcohol abuse Neg Hx   . Arthritis Neg Hx   . Asthma Neg Hx   . Birth defects Neg Hx   . Cancer Neg Hx   . COPD Neg Hx   . Depression Neg Hx   . Drug abuse Neg Hx   . Early death Neg Hx   . Hearing loss Neg Hx   . Heart disease Neg Hx   . Hyperlipidemia Neg Hx   . Hypertension Neg Hx   .  Kidney disease Neg Hx   . Learning disabilities Neg Hx   . Mental illness Neg Hx   . Mental retardation Neg Hx   . Miscarriages / Stillbirths Neg Hx   . Stroke Neg Hx   . Vision loss Neg Hx     Prescriptions Prior to Admission  Medication Sig Dispense Refill Last Dose  . Doxylamine-Pyridoxine (DICLEGIS) 10-10 MG TBEC Take 1 tablet with breakfast and lunch.  Take 2 tablets at bedtime. (Patient not taking: Reported on 09/24/2015) 100 tablet 4 Not Taking  . Elastic Bandages & Supports (T.E.D. BELOW KNEE/M-REGULAR) MISC 1 Device by Does not apply route daily. 1 each 1   . ferrous sulfate 325 (65 FE) MG tablet Take 325 mg by mouth daily with breakfast.   Taking  . fluticasone (FLONASE) 50 MCG/ACT nasal spray Place 2 sprays into both nostrils daily. (Patient not taking: Reported on 09/24/2015) 16 g PRN Not Taking  . Prenatal Vit-Fe Phos-FA-Omega (VITAFOL GUMMIES) 3.33-0.333-34.8 MG CHEW Chew 3 tablets by mouth daily. 90 tablet 12 Taking  . progesterone (ENDOMETRIN) 100 MG vaginal insert Place 100 mg vaginally at bedtime.   Taking    No Known Allergies  Review of Systems: Negative except for what is mentioned in HPI.  Physical Exam: BP 158/93 (BP Location: Right Arm)   Pulse 111   Resp 20   LMP 01/28/2015   SpO2 100%  FHR :  Reassuring x 2 CONSTITUTIONAL: Well-developed, well-nourished female in no acute distress.  HENT:  Normocephalic, atraumatic, External right and left ear normal. Oropharynx is clear and moist EYES: Conjunctivae and EOM are normal. Pupils are equal, round, and reactive to light. No scleral icterus.  NECK: Normal range of motion, supple, no masses SKIN: Skin is warm and dry. No rash noted. Not diaphoretic. No erythema. No pallor. [place]: Alert and oriented to person, place, and time. Normal reflexes, muscle tone coordination. No cranial nerve deficit noted. PSYCHIATRIC: Normal mood and affect. Normal behavior. Normal judgment and thought content. CARDIOVASCULAR: Normal heart rate noted, regular rhythm RESPIRATORY: Effort and breath sounds normal, no problems with respiration noted ABDOMEN: Soft, nontender, nondistended, gravid.  PELVIC: Deferred MUSCULOSKELETAL: Normal range of motion. No edema and no tenderness. 2+ distal pulses.  Pertinent Labs/Studies:   Pending  Assessment and Plan: Jordan Holt is a 30 y.o. G2P1001 with di/di twin IUP  at [redacted]w[redacted]d being admitted for urgent cesarean section. The risks of cesarean section discussed with the patient included but were not limited to: bleeding which may require transfusion or reoperation; infection which may require antibiotics; injury to bowel, bladder, ureters or other surrounding organs; injury to the fetus; need for additional procedures including hysterectomy in the event of a life-threatening hemorrhage; placental abnormalities wth subsequent pregnancies, incisional problems, thromboembolic phenomenon and other postoperative/anesthesia complications. The patient concurred with the proposed plan, giving informed written consent for the procedure. Patient has been NPO since last night she will remain NPO for procedure. Anesthesia and OR aware. Preoperative prophylactic antibiotics and SCDs ordered on call to the OR. To OR when ready.    Verita Schneiders, MD, [place]  Attending [place], [facility]

## 2015-10-08 NOTE — Anesthesia Preprocedure Evaluation (Addendum)
Anesthesia Evaluation  Patient identified by MRN, date of birth, ID band Patient awake    Reviewed: Allergy & Precautions, NPO status , Patient's Chart, lab work & pertinent test results  History of Anesthesia Complications Negative for: history of anesthetic complications  Airway Mallampati: III  TM Distance: >3 FB Neck ROM: Full    Dental no notable dental hx. (+) Dental Advisory Given   Pulmonary neg pulmonary ROS,    Pulmonary exam normal breath sounds clear to auscultation       Cardiovascular negative cardio ROS Normal cardiovascular exam Rhythm:Regular Rate:Normal     Neuro/Psych negative neurological ROS  negative psych ROS   GI/Hepatic negative GI ROS, Neg liver ROS,   Endo/Other  negative endocrine ROS  Renal/GU negative Renal ROS  negative genitourinary   Musculoskeletal negative musculoskeletal ROS (+)   Abdominal   Peds negative pediatric ROS (+)  Hematology negative hematology ROS (+)   Anesthesia Other Findings   Reproductive/Obstetrics (+) Pregnancy twins                             Anesthesia Physical Anesthesia Plan  ASA: II and emergent  Anesthesia Plan: Spinal   Post-op Pain Management:    Induction:   Airway Management Planned:   Additional Equipment:   Intra-op Plan:   Post-operative Plan:   Informed Consent: I have reviewed the patients History and Physical, chart, labs and discussed the procedure including the risks, benefits and alternatives for the proposed anesthesia with the patient or authorized representative who has indicated his/her understanding and acceptance.   Dental advisory given  Plan Discussed with: CRNA  Anesthesia Plan Comments:        Anesthesia Quick Evaluation

## 2015-10-08 NOTE — Anesthesia Procedure Notes (Signed)
Spinal  Patient location during procedure: OR Staffing Anesthesiologist: Lauretta Grill Performed: anesthesiologist  Preanesthetic Checklist Completed: patient identified, site marked, surgical consent, pre-op evaluation, timeout performed, IV checked, risks and benefits discussed and monitors and equipment checked Spinal Block Patient position: sitting Prep: ChloraPrep Patient monitoring: continuous pulse ox, blood pressure and heart rate Approach: midline Location: L3-4 Injection technique: single-shot Needle Needle type: Spinocan  Needle gauge: 24 G Needle length: 9 cm Additional Notes Functioning IV was confirmed and monitors were applied. Sterile prep and drape, including hand hygiene, mask and sterile gloves were used. The patient was positioned and the spine was prepped. The skin was anesthetized with lidocaine.  Free flow of clear CSF was obtained prior to injecting local anesthetic into the CSF.  The spinal needle aspirated freely following injection.  The needle was carefully withdrawn.  The patient tolerated the procedure well. Consent was obtained prior to procedure with all questions answered and concerns addressed. Risks including but not limited to bleeding, infection, nerve damage, paralysis, failed block, inadequate analgesia, allergic reaction, high spinal, itching and headache were discussed and the patient wished to proceed.   Lauretta Grill, MD  Patient moving the entire procedure. Went slowly to avoid sticking myself and do the procedure correctly. Lots of movement after dural puncture and had to adjust the spinal needle once to regain flow.

## 2015-10-09 ENCOUNTER — Encounter (HOSPITAL_COMMUNITY): Payer: Self-pay

## 2015-10-09 LAB — TYPE AND SCREEN
ABO/RH(D): O POS
Antibody Screen: NEGATIVE
UNIT DIVISION: 0
Unit division: 0
Unit division: 0

## 2015-10-09 LAB — CBC
HEMATOCRIT: 26.5 % — AB (ref 36.0–46.0)
HEMOGLOBIN: 8.7 g/dL — AB (ref 12.0–15.0)
MCH: 22 pg — ABNORMAL LOW (ref 26.0–34.0)
MCHC: 32.8 g/dL (ref 30.0–36.0)
MCV: 67.1 fL — AB (ref 78.0–100.0)
Platelets: 200 10*3/uL (ref 150–400)
RBC: 3.95 MIL/uL (ref 3.87–5.11)
RDW: 25.6 % — AB (ref 11.5–15.5)
WBC: 16.3 10*3/uL — AB (ref 4.0–10.5)

## 2015-10-09 LAB — RPR: RPR Ser Ql: NONREACTIVE

## 2015-10-09 MED ORDER — MISOPROSTOL 200 MCG PO TABS
ORAL_TABLET | ORAL | Status: AC
  Filled 2015-10-09: qty 1

## 2015-10-09 MED ORDER — SODIUM CHLORIDE 0.9% FLUSH: 3.0000 mL | INTRAVENOUS | Status: DC | PRN

## 2015-10-09 MED ORDER — SODIUM CHLORIDE 0.9 % IV SOLN: 250.0000 mL | INTRAVENOUS | Status: DC | PRN

## 2015-10-09 MED ORDER — POLYSACCHARIDE IRON COMPLEX 150 MG PO CAPS
150.0000 mg | ORAL_CAPSULE | Freq: Two times a day (BID) | ORAL | Status: DC
  Administered 2015-10-09 – 2015-10-12 (×7): 150 mg via ORAL
  Filled 2015-10-09 (×9): qty 1

## 2015-10-09 MED ORDER — SODIUM CHLORIDE 0.9% FLUSH
3.0000 mL | Freq: Two times a day (BID) | INTRAVENOUS | Status: DC
  Administered 2015-10-09 – 2015-10-11 (×3): 3 mL via INTRAVENOUS

## 2015-10-09 NOTE — Lactation Note (Signed)
This note was copied from a baby's chart. Lactation Consultation Note  Patient Name: LAINY STAUS Prescott [id number] Date: 10/09/2015 Reason for consult: Initial assessment;Infant < 6lbs;Late preterm infant   Maternal Data Does the patient have breastfeeding experience prior to this delivery?: Yes  Feeding Feeding Type: Breast Fed  LATCH Score/Interventions Latch: Grasps breast easily, tongue down, lips flanged, rhythmical sucking.  Audible Swallowing: A few with stimulation  Type of Nipple: Everted at rest and after stimulation  Comfort (Breast/Nipple): Soft / non-tender     Hold (Positioning): Assistance needed to correctly position infant at breast and maintain latch. Intervention(s): Breastfeeding basics reviewed;Support Pillows;Position options;Skin to skin  LATCH Score: 8  Lactation Tools Discussed/Used Pump Review: Setup, frequency, and cleaning;Milk Storage Initiated by:: RN Date initiated:: 10/08/15   Consult Status      Carla Drape S 10/09/2015, 10:55 AM

## 2015-10-09 NOTE — Lactation Note (Signed)
This note was copied from a baby's chart. Lactation Consultation Note  Patient Name: Jordan Holt [id number] Date: 10/09/2015 Reason for consult: Initial assessment;Infant < 6lbs;Late preterm infant Breastfeeding consultation services and support information/Caring For Your Late Preterm Baby given and reviewed with mom.  Mom breastfed her first baby for 2 years. Late preterm policy has been started. Mom states she did not obtain milk with pumping.  Instructed to always post pump/hand express to help establish a good supply.  Babies are starting to show feeding cues.  Assisted with positioning babies together at breast.  Colostrum easily hand expressed.  Both babies latched easily and nursed well.  Mom pleased.  Plan is to put babies to breast with feeding cues, post pump and supplement with 10-15 mls of expressed milk/formula.  Encouraged to call for assist/concerns prn.  Maternal Data    Feeding Feeding Type: Breast Fed Length of feed: 30 min  LATCH Score/Interventions Latch: Grasps breast easily, tongue down, lips flanged, rhythmical sucking. Intervention(s): Adjust position;Assist with latch;Breast massage;Breast compression  Audible Swallowing: A few with stimulation Intervention(s): Alternate breast massage  Type of Nipple: Everted at rest and after stimulation  Comfort (Breast/Nipple): Soft / non-tender     Hold (Positioning): Assistance needed to correctly position infant at breast and maintain latch. Intervention(s): Breastfeeding basics reviewed;Support Pillows;Position options;Skin to skin  LATCH Score: 8  Lactation Tools Discussed/Used Pump Review: Setup, frequency, and cleaning;Milk Storage Initiated by:: RN Date initiated:: 10/09/15   Consult Status Consult Status: Follow-up Date: 10/10/15 Follow-up type: In-patient    Ave Filter 10/09/2015, 10:39 AM

## 2015-10-09 NOTE — Progress Notes (Signed)
Subjective: Postpartum Day #1: Cesarean Delivery for breech/breech twins.  3 U pRBC's in OR.  Preeclampsia. On Mag drip.  Patient reports incisional pain and tolerating PO.    Objective: Vital signs in last 24 hours: Temp:  [97.3 F (36.3 C)-98.6 F (37 C)] 98.3 F (36.8 C) (09/29 0600) Pulse Rate:  [70-111] 91 (09/29 0600) Resp:  [9-27] 16 (09/29 0800) BP: (108-170)/(75-95) 122/75 (09/29 0600) SpO2:  [96 %-100 %] 96 % (09/29 0600) Weight:  [176 lb (79.8 kg)-180 lb 12 oz (82 kg)] 180 lb 12 oz (82 kg) (09/29 0600)  Physical Exam:  General: alert, cooperative and no distress Lochia: appropriate Uterine Fundus: firm Incision: no significant drainage, no dehiscence, no significant erythema DVT Evaluation: No evidence of DVT seen on physical exam. SCDs on.     Recent Labs  10/08/15 1814 10/09/15 0506  HGB 9.9* 8.7*  HCT 30.1* 26.5*    Assessment/Plan: Status post Cesarean section. Postoperative course complicated by preeclampsia: on Mag drip: continue for 24 hours until 1830 this evening.  D/C foley after Mag drip is d/c'd.   Continue current care.  Anemia:  Iron started, asymptomatic at this time, status post blood transfusion.   Morene Crocker, CNM 10/09/2015, 9:10 AM

## 2015-10-10 NOTE — Lactation Note (Signed)
This note was copied from a baby's chart. Lactation Consultation Note  Patient Name: Jordan Holt [id number] Date: 10/10/2015 Reason for consult: Follow-up assessment;Multiple gestation;Infant < 6lbs;Late preterm infant Twin babies who are 31 hours old & seen by Lactation for follow-up assessment. LC calculated adjusted wt loss since babies are Late Preterm infants; baby Boy B's adjusted wt loss from 6hrs of life is 5.3% @ 30 hrs old (Epic calculated wt loss was 8.2%) & baby Girl A's adjusted wt loss from 6hrs of life is 3.4% at 24 hrs old (Epic calculated wt loss was 4.8%). Mom had Girl B with her & FOB had Boy B with him when [facility] entered. Mom reports she had finished BF the twins separately ~15 mins ago & the twins were now getting Neosure & mom reports they drink between 7-10mL. Mom thinks her milk is starting to come in; with breast massage, mom's breasts do feel as though they are filling. Mom reports she has not been pumping lately. Discussed the importance of adding pumping when she can after BF to help her milk supply. Mom stated she plans to try BF both twins together at home and then hopefully will be able to pump afterwards. Reviewed importance of BF on cue but at least every 3hrs & supplementing afterwards. Mom has [place] but has not talked with anyone from there yet. Faxed referral to [facility] & discussed [facility] loaner pump; mom & FOB interested so left paperwork with them to complete and discussed someone from lactation will do this on day of discharge.  Encouraged mom to ask for lactation at next feeding to assess their latch. Mom reports no questions at this time.  Maternal Data    Feeding    LATCH Score/Interventions                      Lactation Tools Discussed/Used WIC Program: Yes   Consult Status Consult Status: Follow-up Date: 10/11/15 Follow-up type: In-patient    Jordan Holt 10/10/2015, 11:20 AM

## 2015-10-10 NOTE — Progress Notes (Signed)
Subjective: Postpartum Day 2: LTCS secondary to twins  Patient reports some mild chest pain which just started. She is due her Motrin. She denies any HA or visual changes. Voiding without problems. Tolerating diet.    Objective: Vital signs in last 24 hours: Temp:  [97.8 F (36.6 C)-98.7 F (37.1 C)] 97.8 F (36.6 C) (09/30 0209) Pulse Rate:  [79-98] 79 (09/30 0209) Resp:  [16-18] 16 (09/30 0209) BP: (121-148)/(72-94) 138/92 (09/30 0209) SpO2:  [96 %-99 %] 99 % (09/30 0209) Weight:  [80.2 kg (176 lb 12 oz)] 80.2 kg (176 lb 12 oz) (09/30 0209)  Physical Exam:  General: alert Lochia: appropriate Uterine Fundus: firm Incision: drsg intact  DVT Evaluation: No evidence of DVT seen on physical exam.   Recent Labs  10/08/15 1814 10/09/15 0506  HGB 9.9* 8.7*  HCT 30.1* 26.5*    Assessment/Plan: Status post Cesarean section. Doing well postoperatively.  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

## 2015-10-11 MED ORDER — HYDROCHLOROTHIAZIDE 25 MG PO TABS
25.0000 mg | ORAL_TABLET | Freq: Every day | ORAL | Status: DC
  Administered 2015-10-11 – 2015-10-12 (×2): 25 mg via ORAL
  Filled 2015-10-11 (×2): qty 1

## 2015-10-11 NOTE — Progress Notes (Signed)
Subjective: Postpartum Day 2: Cesarean Delivery Patient reports incisional pain, tolerating PO, + flatus and no problems voiding.  Magnesium is now off. On Norvasc for BP. Complains of swelling.  Objective: Vital signs in last 24 hours: Temp:  [98 F (36.7 C)-99.1 F (37.3 C)] 98.2 F (36.8 C) (10/01 0548) Pulse Rate:  [76-100] 89 (10/01 0548) Resp:  [14-20] 20 (10/01 0548) BP: (133-150)/(87-97) 145/90 (10/01 0548) SpO2:  [97 %-100 %] 99 % (10/01 0548)  Physical Exam:  General: alert, cooperative and appears stated age Lochia: appropriate Uterine Fundus: firm, NT Incision: healing well, no significant drainage, dressing intact DVT Evaluation: No evidence of DVT seen on physical exam.   Recent Labs  10/08/15 1814 10/09/15 0506  HGB 9.9* 8.7*  HCT 30.1* 26.5*    Assessment/Plan: Status post Cesarean section. Postoperative course complicated by HTN, still poorly controlled.  Continue current care Add HCTZ for swelling and BP control.  Jordan Holt 10/11/2015, 7:56 AM

## 2015-10-11 NOTE — Lactation Note (Signed)
This note was copied from a baby's chart. Lactation Consultation Note  Patient Name: Jordan Holt [id number] Date: 10/11/2015 Reason for consult: Follow-up assessment   With this mom of LPI twins, now 18 days old. Mom has been breastfeeding  both babies, and then supplementing mostly with formula, because she "has not had time to pump" I explained how she can not rely on the twins to protect her milk supply and why, and that she needed to limit their time at the breast to 15-20 minutes each, and then have dad help with bottle feeding, while she pumps. Mom receptive to teaching, and I had her begin pumping while I was there. Mom had a steady flow of milk from both breasts when I left the room. Mom knows to use EBM pumped at next feeding and to feed EBM prior to formula.  Mom not going home today due to high BP, and was started on HCTZ.    Maternal Data    Feeding    LATCH Score/Interventions                      Lactation Tools Discussed/Used     Consult Status Consult Status: Follow-up Date: 10/12/15 Follow-up type: In-patient    Tonna Corner 10/11/2015, 2:28 PM

## 2015-10-12 ENCOUNTER — Encounter (HOSPITAL_COMMUNITY): Payer: Self-pay

## 2015-10-12 ENCOUNTER — Ambulatory Visit (HOSPITAL_COMMUNITY)
Admission: RE | Admit: 2015-10-12 | Discharge: 2015-10-12 | Disposition: A | Discharge status: Home / Self Care | Payer: Medicaid Other | Source: Ambulatory Visit | Attending: Certified Nurse Midwife | Admitting: Certified Nurse Midwife

## 2015-10-12 DIAGNOSIS — Z98891 History of uterine scar from previous surgery: Secondary | ICD-10-CM

## 2015-10-12 MED ORDER — OXYCODONE-ACETAMINOPHEN 5-325 MG PO TABS: 1.0000 | ORAL_TABLET | Freq: Four times a day (QID) | ORAL | 0 refills | Status: DC | PRN

## 2015-10-12 MED ORDER — HYDROCHLOROTHIAZIDE 25 MG PO TABS: 25.0000 mg | ORAL_TABLET | Freq: Every day | ORAL | 1 refills | Status: DC

## 2015-10-12 MED ORDER — AMLODIPINE BESYLATE 10 MG PO TABS: 10.0000 mg | ORAL_TABLET | Freq: Every day | ORAL | 1 refills | Status: DC

## 2015-10-12 NOTE — Progress Notes (Signed)
Patient has received discharge paperwork.  Babies will be rooming in with Mother, due to weight loss.  Lactation consulted to see patient as advised by pediatrician earlier this date.  Patient has received hard copy narcotic script, and has been advised to have spouse retrieve blood pressure medications and pain med (script) from pharmacy.

## 2015-10-12 NOTE — Lactation Note (Signed)
This note was copied from a baby's chart. Lactation Consultation Note  Patient Name: Jordan Holt [id number] Date: 10/12/2015 Reason for consult: Follow-up assessment;Infant weight loss;Infant < 6lbs;Late preterm infant;Multiple gestation    LPI twins. Baby Boy "A" has 9% weight loss, and mom is aware. Mom has just finished pumping over 4 ounces of EBM and has bottles ready for feeding after baby's at the breast. Mom reports that she nursed her first child exclusively for first 6 months and then a total of 2 years. Assisted mom to latch baby Boy "A" to left breast in football position. Mom is easily compressible and expressible--even after just finishing pumping. Baby latched deeply--eagerly--and suckled rhythmically with some swallows noted. Baby nursed for 20 minutes, and would only take about 5 ml of EBM initially. Enc mom to continue to offer supplement after baby's diaper change. Reviewed LPI behavior and feeding guidelines. Baby had 3 voids, a large stool and a small stool while LC in the room.  FOB feeding baby Girl "B" a bottle of EBM. Mom states the baby too hungry to put to the breast at this feeding because she was busy with baby boy. Mom states that the baby girl is latching and nursing well, and has not lost as much weight. Enc mom to put each baby to breast with each feeding, and discussed how to nurse simultaneously. Discussed how this will help each baby learn to nurse well. Discussed with parents that babies will nurse better the closer they get to their due date, if mom can keep offering the breast with each feeding. Enc mom to call for assistance with latching as needed.   Enc mom to nurse both babies with cues and at least every 3 hours--even if she has to wake the babies to feed. Enc nursing STS to keep the babies alert at the breast.   Maternal Data Has patient been taught Hand Expression?: Yes  Feeding Feeding Type: Breast Fed Length of feed: 20 min  LATCH  Score/Interventions Latch: Grasps breast easily, tongue down, lips flanged, rhythmical sucking.  Audible Swallowing: A few with stimulation  Type of Nipple: Everted at rest and after stimulation  Comfort (Breast/Nipple): Soft / non-tender     Hold (Positioning): Assistance needed to correctly position infant at breast and maintain latch. Intervention(s): Breastfeeding basics reviewed;Support Pillows;Position options;Skin to skin  LATCH Score: 8  Lactation Tools Discussed/Used     Consult Status Consult Status: Follow-up Date: 10/13/15 Follow-up type: In-patient    Andres Labrum 10/12/2015, 2:41 PM

## 2015-10-12 NOTE — Discharge Instructions (Signed)
Preeclampsia and Eclampsia °Preeclampsia is a serious condition that develops only during pregnancy. It is also called toxemia of pregnancy. This condition causes high blood pressure along with other symptoms, such as swelling and headaches. These may develop as the condition gets worse. Preeclampsia may occur 20 weeks or later into your pregnancy.  °Diagnosing and treating preeclampsia early is very important. If not treated early, it can cause serious problems for you and your baby. One problem it can lead to is eclampsia, which is a condition that causes muscle jerking or shaking (convulsions) in the mother. Delivering your baby is the best treatment for preeclampsia or eclampsia.  °RISK FACTORS °The cause of preeclampsia is not known. You may be more likely to develop preeclampsia if you have certain risk factors. These include:  °· Being pregnant for the first time. °· Having preeclampsia in a past pregnancy. °· Having a family history of preeclampsia. °· Having high blood pressure. °· Being pregnant with twins or triplets. °· Being 35 or older. °· Being African American. °· Having kidney disease or diabetes. °· Having medical conditions such as lupus or blood diseases. °· Being very overweight (obese). °SIGNS AND SYMPTOMS  °The earliest signs of preeclampsia are: °· High blood pressure. °· Increased protein in your urine. Your health care provider will check for this at every prenatal visit. °Other symptoms that can develop include:  °· Severe headaches. °· Sudden weight gain. °· Swelling of your hands, face, legs, and feet. °· Feeling sick to your stomach (nauseous) and throwing up (vomiting). °· Vision problems (blurred or double vision). °· Numbness in your face, arms, legs, and feet. °· Dizziness. °· Slurred speech. °· Sensitivity to bright lights. °· Abdominal pain. °DIAGNOSIS  °There are no screening tests for preeclampsia. Your health care provider will ask you about symptoms and check for signs of  preeclampsia during your prenatal visits. You may also have tests, including: °· Urine testing. °· Blood testing. °· Checking your baby's heart rate. °· Checking the health of your baby and your placenta using images created with sound waves (ultrasound). °TREATMENT  °You can work out the best treatment approach together with your health care provider. It is very important to keep all prenatal appointments. If you have an increased risk of preeclampsia, you may need more frequent prenatal exams. °· Your health care provider may prescribe bed rest. °· You may have to eat as little salt as possible. °· You may need to take medicine to lower your blood pressure if the condition does not respond to more conservative measures. °· You may need to stay in the hospital if your condition is severe. There, treatment will focus on controlling your blood pressure and fluid retention. You may also need to take medicine to prevent seizures. °· If the condition gets worse, your baby may need to be delivered early to protect you and the baby. You may have your labor started with medicine (be induced), or you may have a cesarean delivery. °· Preeclampsia usually goes away after the baby is born. °HOME CARE INSTRUCTIONS  °· Only take over-the-counter or prescription medicines as directed by your health care provider. °· Lie on your left side while resting. This keeps pressure off your baby. °· Elevate your feet while resting. °· Get regular exercise. Ask your health care provider what type of exercise is safe for you. °· Avoid caffeine and alcohol. °· Do not smoke. °· Drink 6-8 glasses of water every day. °· Eat a balanced diet   that is low in salt. Do not add salt to your food.  Avoid stressful situations as much as possible.  Get plenty of rest and sleep.  Keep all prenatal appointments and tests as scheduled. SEEK MEDICAL CARE IF:  You are gaining more weight than expected.  You have any headaches, abdominal pain, or  nausea.  You are bruising more than usual.  You feel dizzy or light-headed. SEEK IMMEDIATE MEDICAL CARE IF:   You develop sudden or severe swelling anywhere in your body. This usually happens in the legs.  You gain 5 lb (2.3 kg) or more in a week.  You have a severe headache, dizziness, problems with your vision, or confusion.  You have severe abdominal pain.  You have lasting nausea or vomiting.  You have a seizure.  You have trouble moving any part of your body.  You develop numbness in your body.  You have trouble speaking.  You have any abnormal bleeding.  You develop a stiff neck.  You pass out. MAKE SURE YOU:   Understand these instructions.  Will watch your condition.  Will get help right away if you are not doing well or get worse.   This information is not intended to replace advice given to you by your health care provider. Make sure you discuss any questions you have with your health care provider.   Document Released: 12/25/1999 Document Revised: 01/01/2013 Document Reviewed: 10/19/2012 Elsevier Interactive Patient Education 2016 [employer] Cesarean Delivery, Care After Refer to this sheet in the next few weeks. These instructions provide you with information on caring for yourself after your procedure. Your health care provider may also give you specific instructions. Your treatment has been planned according to current medical practices, but problems sometimes occur. Call your health care provider if you have any problems or questions after you go home. HOME CARE INSTRUCTIONS  Only take over-the-counter or prescription medications as directed by your health care provider.  Do not drink alcohol, especially if you are breastfeeding or taking medication to relieve pain.  Do not chew or smoke tobacco.  Continue to use good perineal care. Good perineal care includes:  Wiping your perineum from front to back.  Keeping your perineum clean.  Check  your surgical cut (incision) daily for increased redness, drainage, swelling, or separation of skin.  Clean your incision gently with soap and water every day, and then pat it dry. If your health care provider says it is okay, leave the incision uncovered. Use a bandage (dressing) if the incision is draining fluid or appears irritated. If the adhesive strips across the incision do not fall off within 7 days, carefully peel them off.  Hug a pillow when coughing or sneezing until your incision is healed. This helps to relieve pain.  Do not use tampons or douche until your health care provider says it is okay.  Shower, wash your hair, and take tub baths as directed by your health care provider.  Wear a well-fitting bra that provides breast support.  Limit wearing support panties or control-top hose.  Drink enough fluids to keep your urine clear or pale yellow.  Eat high-fiber foods such as whole grain cereals and breads, brown rice, beans, and fresh fruits and vegetables every day. These foods may help prevent or relieve constipation.  Resume activities such as climbing stairs, driving, lifting, exercising, or traveling as directed by your health care provider.  Talk to your health care provider about resuming sexual activities. This is dependent  upon your risk of infection, your rate of healing, and your comfort and desire to resume sexual activity.  Try to have someone help you with your household activities and your newborn for at least a few days after you leave the hospital.  Rest as much as possible. Try to rest or take a nap when your newborn is sleeping.  Increase your activities gradually.  Keep all of your scheduled postpartum appointments. It is very important to keep your scheduled follow-up appointments. At these appointments, your health care provider will be checking to make sure that you are healing physically and emotionally. SEEK MEDICAL CARE IF:   You are passing large  clots from your vagina. Save any clots to show your health care provider.  You have a foul smelling discharge from your vagina.  You have trouble urinating.  You are urinating frequently.  You have pain when you urinate.  You have a change in your bowel movements.  You have increasing redness, pain, or swelling near your incision.  You have pus draining from your incision.  Your incision is separating.  You have painful, hard, or reddened breasts.  You have a severe headache.  You have blurred vision or see spots.  You feel sad or depressed.  You have thoughts of hurting yourself or your newborn.  You have questions about your care, the care of your newborn, or medications.  You are dizzy or light-headed.  You have a rash.  You have pain, redness, or swelling at the site of the removed intravenous access (IV) tube.  You have nausea or vomiting.  You stopped breastfeeding and have not had a menstrual period within 12 weeks of stopping.  You are not breastfeeding and have not had a menstrual period within 12 weeks of delivery.  You have a fever. SEEK IMMEDIATE MEDICAL CARE IF:  You have persistent pain.  You have chest pain.  You have shortness of breath.  You faint.  You have leg pain.  You have stomach pain.  Your vaginal bleeding saturates 2 or more sanitary pads in 1 hour. MAKE SURE YOU:   Understand these instructions.  Will watch your condition.  Will get help right away if you are not doing well or get worse.   This information is not intended to replace advice given to you by your health care provider. Make sure you discuss any questions you have with your health care provider.   Document Released: 09/18/2001 Document Revised: 01/17/2014 Document Reviewed: 08/24/2011 Elsevier Interactive Patient Education [employer].

## 2015-10-12 NOTE — Discharge Summary (Addendum)
OB Discharge Summary  Patient Name: Jordan Holt DOB: [birth year] MRN: [id number]  Date of admission: 10/08/2015 Delivering MD:    Jordan Holt [id number]  Jordan Holt   Jordan Holt [id number]      Jordan Holt [id number]  Jordan Holt   Date of discharge: 10/12/2015  Admitting diagnosis: LABOR Intrauterine pregnancy: [redacted]w[redacted]d     Secondary diagnosis:Principal Problem:   S/P cesarean section Active Problems:   Twin pregnancy in third trimester   Breech presentation, antepartum  Additional problems:Pre-eclampsia , intraoperative hemorrhage and s/p transfusion of 3 u PRBC's.    Discharge diagnosis: Preterm Pregnancy Delivered, Preeclampsia (severe), Anemia and intraoperative hemorrhage                                                                     Post partum procedures:Magnesium Sulfate x 24 hours  Augmentation: None  Complications: [id number]  Hospital course:  Onset of Labor With Unplanned C/S  30 y.o. yo [id number] at [redacted]w[redacted]d was admitted in [place] on 10/08/2015. Patient had a labor course significant for breech presentation of both twins. Membrane Rupture Time/Date:    Jordan Holt [id number]  10:43 AM   Jordan Holt [id number]      Jordan Holt [id number]  10:46 AM ,   Jordan Holt [id number]  10/08/2015   Jordan Holt [id number]      Jordan Holt [id number]  10/08/2015   The patient went for cesarean section due to [facility], and delivered a Viable infants,   Jordan Holt [id number]  10/08/2015   Jordan Holt [id number]    Jordan Holt [id number]  10/08/2015  Details of operation can be found in separate operative note. Patient had an uncomplicated postpartum course.  She is ambulating,tolerating a regular diet, passing flatus, and urinating well.  Patient is discharged home in stable condition 10/12/15. She  required 2 medications for BP control, both HCTZ and Norvasc added postpartum and BPs remain in the 140/90 range.   Physical exam Vitals:   10/11/15 1100 10/11/15 1734 10/11/15 2100 10/12/15 0548  BP: (!) 144/90 125/81 138/88 (!) 140/93  Pulse: 91 96 (!) 106 83  Resp: 18 18 19 16   Temp: 98.4 F (36.9 C) 98.2 F (36.8 C) 100.1 F (37.8 C) 98.5 F (36.9 C)  TempSrc: Oral Oral Oral Oral  SpO2: 99% 100% 100% 100%  Weight:      Height:       General: alert, cooperative and no distress Lochia: appropriate Uterine Fundus: firm Incision: Dressing is clean, dry, and intact DVT Evaluation: No evidence of DVT seen on physical exam. Negative Homan's sign. Labs: Lab Results  Component Value Date   WBC 16.3 (H) 10/09/2015   HGB 8.7 (L) 10/09/2015   HCT 26.5 (L) 10/09/2015   MCV 67.1 (L) 10/09/2015   PLT 200 10/09/2015   CMP Latest Ref Rng & Units 10/08/2015  Glucose 65 - 99 mg/dL 100(H)  BUN 6 - 20 mg/dL <5(L)  Creatinine 0.44 - 1.00 mg/dL 0.71  Sodium 135 - 145 mmol/L 138  Potassium 3.5 - 5.1 mmol/L 3.5  Chloride 101 - 111 mmol/L 110  CO2 22 - 32 mmol/L 21(L)  Calcium 8.9 - 10.3 mg/dL 8.5(L)  Total Protein 6.5 - 8.1 g/dL 4.7(L)  Total Bilirubin 0.3 - 1.2 mg/dL 1.2  Alkaline Phos 38 - 126 U/L 200(H)  AST 15 - 41 U/L 35  ALT 14 - 54 U/L 22    Discharge instruction: per After Visit Summary and "Baby and Me Booklet".  After Visit Meds:    Medication List    STOP taking these medications   Doxylamine-Pyridoxine 10-10 MG Tbec Commonly known as:  DICLEGIS     TAKE these medications   amLODipine 10 MG tablet Commonly known as:  NORVASC Take 1 tablet (10 mg total) by mouth daily.   ferrous sulfate 325 (65 FE) MG tablet Take 325 mg by mouth daily with breakfast.   fluticasone 50 MCG/ACT nasal spray Commonly known as:  FLONASE Place 2 sprays into both nostrils daily.   hydrochlorothiazide 25 MG tablet Commonly known as:  HYDRODIURIL Take 1 tablet (25 mg total) by  mouth daily.   oxyCODONE-acetaminophen 5-325 MG tablet Commonly known as:  PERCOCET/ROXICET Take 1-2 tablets by mouth every 6 (six) hours as needed.   progesterone 100 MG vaginal insert Commonly known as:  ENDOMETRIN Place 100 mg vaginally at bedtime.   T.E.D. BELOW KNEE/M-REGULAR Misc 1 Device by Does not apply route daily.   VITAFOL GUMMIES 3.33-0.333-34.8 MG Chew Chew 3 tablets by mouth daily.       Diet: routine diet  Activity: Advance as tolerated. Pelvic rest for 6 weeks.   Outpatient follow up:1 wk for BP and incision check and 4-6 wks for pp visit  Postpartum contraception: None  Newborn Data:   Jordan Holt [id number]  Live born female  Birth Weight: 5 lb 10.5 oz (2565 g) APGAR: 7, 8   Jordan Holt [id number]  Live born unspecified sex  Birth Weight:   APGARMarland Kitchen ,    Jordan Holt [id number]  Live born female  Birth Weight: 5 lb 4.5 oz (2395 g) APGAR: 8, 9  Baby Feeding: Breast Disposition:home with mother   10/12/2015 Jordan Jude, MD

## 2015-10-13 ENCOUNTER — Ambulatory Visit: Payer: Self-pay

## 2015-10-13 NOTE — Lactation Note (Signed)
This note was copied from a baby's chart. Lactation Consultation Note  Patient Name: Jordan Holt [id number] Date: 10/13/2015   Twins 69 days old. Mom reports that both babies are latching and nursing better, she is getting lots of EBM, and both babies are taking EBM by bottle well. Mom wanted a [facility] loaner pump, but does not have cash. Mom has bank card and is aware of ATM location and states that she will let the person who is picking her up drive her to the [facility] office to pick up a loaner pump. Mom states that she was aware that she needed cash for the pump because she had a [facility] loaner with her first child, but she forgot to take care of it. Mom aware of OP/BFSG and [place] phone line assistance after D/C.   Maternal Data    Feeding Feeding Type: Bottle Fed - Breast Milk Length of feed: 20 min  LATCH Score/Interventions                      Lactation Tools Discussed/Used     Consult Status      Andres Labrum 10/13/2015, 11:23 AM

## 2015-10-22 ENCOUNTER — Ambulatory Visit (INDEPENDENT_AMBULATORY_CARE_PROVIDER_SITE_OTHER): Payer: Medicaid Other | Admitting: Certified Nurse Midwife

## 2015-10-22 ENCOUNTER — Encounter: Payer: Self-pay | Admitting: Certified Nurse Midwife

## 2015-10-22 MED ORDER — IBUPROFEN 800 MG PO TABS: 800.0000 mg | ORAL_TABLET | Freq: Three times a day (TID) | ORAL | 1 refills | Status: DC | PRN

## 2015-10-22 NOTE — Progress Notes (Signed)
Subjective:     Jordan Holt is a 30 y.o. female who presents for a postpartum visit. She is 2 weeks postpartum following a low cervical transverse Cesarean section. I have fully reviewed the prenatal and intrapartum course. The delivery was at 36.1 gestational weeks. Outcome: primary cesarean section, low transverse incision for breech twins. Anesthesia: spinal. Postpartum course has been normal. Baby's course has been normal. Baby is feeding by breast. Bleeding thin lochia, brown and pink. Bowel function is normal. Bladder function is normal. Patient is not sexually active. Contraception method is abstinence. Postpartum depression screening: negative.  Tobacco, alcohol and substance abuse history reviewed.  Adult immunizations reviewed including TDAP, rubella and varicella.  The following portions of the patient's history were reviewed and updated as appropriate: allergies, current medications, past family history, past medical history, past social history, past surgical history and problem list.  Review of Systems Pertinent items noted in HPI and remainder of comprehensive ROS otherwise negative.   Objective:    BP (!) 127/92   Pulse 87   Temp 98.2 F (36.8 C) (Oral)   Wt 141 lb 9.6 oz (64.2 kg)   Breastfeeding? Yes   BMI 22.51 kg/m   General:  alert, cooperative and no distress   Breasts:  inspection negative, no nipple discharge or bleeding, no masses or nodularity palpable  Lungs: clear to auscultation bilaterally  Heart:  regular rate and rhythm, S1, S2 normal, no murmur, click, rub or gallop  Abdomen: soft, non-tender; bowel sounds normal; no masses,  no organomegaly, C-section wound well approximated, healing well no s/s infection, no erythema.     Vulva:  normal  Corpus: enlarged, 15 weeks size  Adnexa:  not evaluated  Rectal Exam: Not performed.          50% of 15 min visit spent on counseling and coordination of care.  Assessment:     normal 2 week postpartum exam. Pap  smear not done at today's visit.     Uterine subinvolution   H/O anemia: check Hgb next exam.  Plan:    1. Contraception: abstinence 2.  Annual exam due 04/10/16 3. Follow up in: 4 weeks or as needed.  2hr GTT for h/o GDM/screening for DM q 3 yrs per ADA recommendations Preconception counseling provided Healthy lifestyle practices reviewed

## 2015-10-23 ENCOUNTER — Encounter (HOSPITAL_COMMUNITY): Payer: Self-pay | Admitting: Obstetrics & Gynecology

## 2015-11-19 ENCOUNTER — Ambulatory Visit: Payer: Medicaid Other | Admitting: Certified Nurse Midwife

## 2015-12-08 ENCOUNTER — Encounter: Payer: Self-pay | Admitting: *Deleted

## 2015-12-08 ENCOUNTER — Ambulatory Visit (INDEPENDENT_AMBULATORY_CARE_PROVIDER_SITE_OTHER): Payer: Medicaid Other | Admitting: Certified Nurse Midwife

## 2015-12-08 VITALS — BP 121/76 | HR 101 | Wt 140.0 lb

## 2015-12-08 DIAGNOSIS — D508 Other iron deficiency anemias: Secondary | ICD-10-CM | POA: Diagnosis not present

## 2015-12-08 DIAGNOSIS — Z30011 Encounter for initial prescription of contraceptive pills: Secondary | ICD-10-CM

## 2015-12-08 MED ORDER — NORETHIN-ETH ESTRAD-FE BIPHAS 1 MG-10 MCG / 10 MCG PO TABS
1.0000 | ORAL_TABLET | Freq: Every day | ORAL | 4 refills | Status: DC
Start: 2015-12-08 — End: 2018-10-16

## 2015-12-08 NOTE — Progress Notes (Signed)
Post Partum Exam  Jordan Holt is a 30 y.o. G61P1103 female who presents for a postpartum visit. She is 8 weeks postpartum following a low cervical transverse Cesarean section. I have fully reviewed the prenatal and intrapartum course. The delivery was at 36.2 gestational weeks.  Anesthesia: spinal. Postpartum course has been doing well. Baby's course has been doing well. Baby is feeding by both breast and bottle - Similac Advance. Bleeding no bleeding. Bowel function is normal. Bladder function is normal. Patient is not sexually active. Contraception method is none. Postpartum depression screening:neg  The following portions of the patient's history were reviewed and updated as appropriate: allergies, current medications, past family history, past medical history, past social history, past surgical history and problem list.  Review of Systems Pertinent items noted in HPI and remainder of comprehensive ROS otherwise negative.    Objective:    BP 116/78 mmHg  Pulse 78  Resp 16  Ht 5\' 5"  (1.651 m)  Wt 211 lb (95.709 kg)  BMI 35.11 kg/m2  Breastfeeding? Yes  General:  alert, cooperative and no distress   Breasts:  inspection negative, no nipple discharge or bleeding, no masses or nodularity palpable  Lungs: clear to auscultation bilaterally  Heart:  regular rate and rhythm, S1, S2 normal, no murmur, click, rub or gallop  Abdomen: soft, non-tender; bowel sounds normal; no masses,  no organomegaly   Vulva:  normal  Vagina: normal vagina, no discharge, exudate, lesion, or erythema  Cervix:  no cervical motion tenderness  Corpus: normal size, contour, position, consistency, mobility, non-tender  Adnexa:  normal adnexa  Rectal Exam: Not performed.        Assessment:    Normal 8 week postpartum exam. Pap smear not done at today's visit.   H/O anemia  Contraception management   Plan:   1. Contraception: OCP (estrogen/progesterone) 2. F/U end of March 2018 for annual exam 3. Follow up  in: 3 months or as needed.

## 2015-12-09 LAB — CBC
HEMOGLOBIN: 10.3 g/dL — AB (ref 11.1–15.9)
Hematocrit: 33.2 % — ABNORMAL LOW (ref 34.0–46.6)
MCH: 22.9 pg — AB (ref 26.6–33.0)
MCHC: 31 g/dL — AB (ref 31.5–35.7)
MCV: 74 fL — AB (ref 79–97)
Platelets: 319 10*3/uL (ref 150–379)
RBC: 4.5 x10E6/uL (ref 3.77–5.28)
RDW: 24.1 % — ABNORMAL HIGH (ref 12.3–15.4)
WBC: 7.9 10*3/uL (ref 3.4–10.8)

## 2015-12-10 ENCOUNTER — Encounter: Payer: Self-pay | Admitting: Certified Nurse Midwife

## 2015-12-14 ENCOUNTER — Other Ambulatory Visit: Payer: Self-pay | Admitting: Certified Nurse Midwife

## 2016-05-11 ENCOUNTER — Encounter (HOSPITAL_COMMUNITY): Payer: Self-pay | Admitting: Obstetrics & Gynecology

## 2016-06-13 ENCOUNTER — Other Ambulatory Visit: Payer: Self-pay | Admitting: Certified Nurse Midwife

## 2017-12-22 ENCOUNTER — Emergency Department
Admission: EM | Admit: 2017-12-22 | Discharge: 2017-12-22 | Disposition: A | Discharge status: Home / Self Care | Payer: BC Managed Care – PPO | Attending: Emergency Medicine | Admitting: Emergency Medicine

## 2017-12-22 ENCOUNTER — Other Ambulatory Visit: Payer: Self-pay

## 2017-12-22 ENCOUNTER — Encounter: Payer: Self-pay | Admitting: Emergency Medicine

## 2017-12-22 DIAGNOSIS — K2900 Acute gastritis without bleeding: Secondary | ICD-10-CM | POA: Insufficient documentation

## 2017-12-22 DIAGNOSIS — Z79899 Other long term (current) drug therapy: Secondary | ICD-10-CM | POA: Diagnosis not present

## 2017-12-22 DIAGNOSIS — R109 Unspecified abdominal pain: Secondary | ICD-10-CM | POA: Diagnosis present

## 2017-12-22 LAB — URINALYSIS, COMPLETE (UACMP) WITH MICROSCOPIC
BACTERIA UA: NONE SEEN
BILIRUBIN URINE: NEGATIVE
GLUCOSE, UA: NEGATIVE mg/dL
HGB URINE DIPSTICK: NEGATIVE
Ketones, ur: NEGATIVE mg/dL
LEUKOCYTES UA: NEGATIVE
NITRITE: NEGATIVE
PH: 7 (ref 5.0–8.0)
Protein, ur: NEGATIVE mg/dL
SPECIFIC GRAVITY, URINE: 1.018 (ref 1.005–1.030)

## 2017-12-22 LAB — COMPREHENSIVE METABOLIC PANEL
ALT: 15 U/L (ref 0–44)
AST: 20 U/L (ref 15–41)
Albumin: 4.1 g/dL (ref 3.5–5.0)
Alkaline Phosphatase: 65 U/L (ref 38–126)
Anion gap: 5 (ref 5–15)
BUN: 11 mg/dL (ref 6–20)
CHLORIDE: 107 mmol/L (ref 98–111)
CO2: 25 mmol/L (ref 22–32)
CREATININE: 0.77 mg/dL (ref 0.44–1.00)
Calcium: 8.9 mg/dL (ref 8.9–10.3)
Glucose, Bld: 107 mg/dL — ABNORMAL HIGH (ref 70–99)
POTASSIUM: 3.3 mmol/L — AB (ref 3.5–5.1)
Sodium: 137 mmol/L (ref 135–145)
TOTAL PROTEIN: 7.5 g/dL (ref 6.5–8.1)
Total Bilirubin: 0.7 mg/dL (ref 0.3–1.2)

## 2017-12-22 LAB — POCT PREGNANCY, URINE: Preg Test, Ur: NEGATIVE

## 2017-12-22 LAB — CBC
HEMATOCRIT: 36.2 % (ref 36.0–46.0)
Hemoglobin: 11.1 g/dL — ABNORMAL LOW (ref 12.0–15.0)
MCH: 21.9 pg — ABNORMAL LOW (ref 26.0–34.0)
MCHC: 30.7 g/dL (ref 30.0–36.0)
MCV: 71.4 fL — ABNORMAL LOW (ref 80.0–100.0)
NRBC: 0 % (ref 0.0–0.2)
Platelets: 419 10*3/uL — ABNORMAL HIGH (ref 150–400)
RBC: 5.07 MIL/uL (ref 3.87–5.11)
RDW: 17.5 % — AB (ref 11.5–15.5)
WBC: 8.7 10*3/uL (ref 4.0–10.5)

## 2017-12-22 LAB — LIPASE, BLOOD: LIPASE: 44 U/L (ref 11–51)

## 2017-12-22 MED ORDER — ONDANSETRON 4 MG PO TBDP: 4.0000 mg | ORAL_TABLET | Freq: Three times a day (TID) | ORAL | 0 refills | Status: DC | PRN

## 2017-12-22 NOTE — ED Provider Notes (Signed)
[facility] Emergency Department Provider Note   ____________________________________________    I have reviewed the triage vital signs and the nursing notes.   HISTORY  Chief Complaint Abdominal Pain     HPI Jordan Holt is a 32 y.o. female who presents with complaints of abdominal pain.  Patient describes mild intermittent abdominal cramping, primarily in the epigastrium.  Has not taken anything for this.  Pain does not radiate.  No fevers or chills, mild nausea.  Some body aches.  No sick contacts reported.  No recent travel.  Some loose stools no vomiting.  Positive fatigue   Past Medical History:  Diagnosis Date  . PCOS (polycystic ovarian syndrome)     Patient Active Problem List   Diagnosis Date Noted  . S/P cesarean section 10/08/2015  . Iron deficiency anemia of pregnancy 09/23/2015  . Breech presentation, antepartum 04/09/2015  . Iron (Fe) deficiency anemia 08/07/2012  . Twin pregnancy in third trimester 07/30/2012  . Menorrhagia 03/14/2012    Past Surgical History:  Procedure Laterality Date  . CESAREAN SECTION MULTI-GESTATIONAL  10/08/2015   Procedure: CESAREAN SECTION MULTI-GESTATIONAL;  Surgeon: Osborne Oman, MD;  Location: [facility];  Service: Obstetrics;;  . NO PAST SURGERIES      Prior to Admission medications   Medication Sig Start Date End Date Taking? Authorizing Provider  amLODipine (NORVASC) 10 MG tablet Take 1 tablet (10 mg total) by mouth daily. Patient not taking: Reported on 12/08/2015 10/12/15   Donnamae Jude, MD  Elastic Bandages & Supports (T.E.D. BELOW KNEE/M-REGULAR) MISC 1 Device by Does not apply route daily. Patient not taking: Reported on 12/08/2015 09/24/15   Fatima Blank A, CNM  ferrous sulfate 325 (65 FE) MG tablet Take 325 mg by mouth daily with breakfast.    [provider]  fluticasone (FLONASE) 50 MCG/ACT nasal spray Place 2 sprays into both nostrils daily. Patient  not taking: Reported on 10/22/2015 08/28/15   Kandis Cocking A, CNM  hydrochlorothiazide (HYDRODIURIL) 25 MG tablet Take 1 tablet (25 mg total) by mouth daily. Patient not taking: Reported on 12/08/2015 10/12/15   Donnamae Jude, MD  ibuprofen (ADVIL,MOTRIN) 800 MG tablet Take 1 tablet (800 mg total) by mouth every 8 (eight) hours as needed. Patient not taking: Reported on 12/08/2015 10/22/15   Kandis Cocking A, CNM  Norethindrone-Ethinyl Estradiol-Fe Biphas (LO LOESTRIN FE) 1 MG-10 MCG / 10 MCG tablet Take 1 tablet by mouth daily. Take 1 tablet by mouth daily. 12/08/15   Kandis Cocking A, CNM  ondansetron (ZOFRAN ODT) 4 MG disintegrating tablet Take 1 tablet (4 mg total) by mouth every 8 (eight) hours as needed for nausea or vomiting. 12/22/17   Lavonia Drafts, MD  oxyCODONE-acetaminophen (PERCOCET/ROXICET) 5-325 MG tablet Take 1-2 tablets by mouth every 6 (six) hours as needed. 10/12/15   Donnamae Jude, MD  Prenatal Vit-Fe Phos-FA-Omega (VITAFOL GUMMIES) 3.33-0.333-34.8 MG CHEW Chew 3 tablets by mouth daily. 05/07/15   Kandis Cocking A, CNM  progesterone (ENDOMETRIN) 100 MG vaginal insert Place 100 mg vaginally at bedtime. 08/05/15   [provider]     Allergies Patient has no known allergies.  Family History  Problem Relation Age of Onset  . Diabetes Mother   . Alcohol abuse Neg Hx   . Arthritis Neg Hx   . Asthma Neg Hx   . Birth defects Neg Hx   . Cancer Neg Hx   . COPD Neg Hx   . Depression Neg Hx   .  Drug abuse Neg Hx   . Early death Neg Hx   . Hearing loss Neg Hx   . Heart disease Neg Hx   . Hyperlipidemia Neg Hx   . Hypertension Neg Hx   . Kidney disease Neg Hx   . Learning disabilities Neg Hx   . Mental illness Neg Hx   . Mental retardation Neg Hx   . Miscarriages / Stillbirths Neg Hx   . Stroke Neg Hx   . Vision loss Neg Hx     Social History Social History   Tobacco Use  . Smoking status: Never Smoker  . Smokeless tobacco: Never Used  Substance  Use Topics  . Alcohol use: No  . Drug use: No    Review of Systems  Constitutional: No fever/chills Eyes: No visual changes.  ENT: No sore throat. Cardiovascular: Denies chest pain. Respiratory: Denies shortness of breath. Gastrointestinal: As above Genitourinary: Negative for dysuria. Musculoskeletal: Myalgias Skin: Negative for rash. Neurological: Negative for headaches    ____________________________________________   PHYSICAL EXAM:  VITAL SIGNS: ED Triage Vitals [12/22/17 1601]  Enc Vitals Group     BP      Pulse      Resp      Temp      Temp src      SpO2      Weight 63.5 kg (139 lb 15.9 oz)     Height      Head Circumference      Peak Flow      Pain Score 2     Pain Loc      Pain Edu?      Excl. in [place]?     Constitutional: Alert and oriented.  Eyes: Conjunctivae are normal.   Nose: No congestion/rhinnorhea. Mouth/Throat: Mucous membranes are moist.    Cardiovascular: Normal rate, regular rhythm. Grossly normal heart sounds.  Good peripheral circulation. Respiratory: Normal respiratory effort.  No retractions. Lungs CTAB. Gastrointestinal: Soft and nontender. No distention.    Musculoskeletal: No lower extremity tenderness nor edema.  Warm and well perfused Neurologic:  Normal speech and language. No gross focal neurologic deficits are appreciated.  Skin:  Skin is warm, dry and intact. No rash noted. Psychiatric: Mood and affect are normal. Speech and behavior are normal.  ____________________________________________   LABS (all labs ordered are listed, but only abnormal results are displayed)  Labs Reviewed  COMPREHENSIVE METABOLIC PANEL - Abnormal; Notable for the following components:      Result Value   Potassium 3.3 (*)    Glucose, Bld 107 (*)    All other components within normal limits  CBC - Abnormal; Notable for the following components:   Hemoglobin 11.1 (*)    MCV 71.4 (*)    MCH 21.9 (*)    RDW 17.5 (*)    Platelets 419 (*)     All other components within normal limits  URINALYSIS, COMPLETE (UACMP) WITH MICROSCOPIC - Abnormal; Notable for the following components:   Color, Urine YELLOW (*)    APPearance CLEAR (*)    All other components within normal limits  LIPASE, BLOOD  POCT PREGNANCY, URINE  POC URINE PREG, ED   ____________________________________________  EKG   ____________________________________________  RADIOLOGY  None ____________________________________________   PROCEDURES  Procedure(s) performed: No  Procedures   Critical Care performed: No ____________________________________________   INITIAL IMPRESSION / ASSESSMENT AND PLAN / ED COURSE  Pertinent labs & imaging results that were available during my care of the patient were reviewed  by me and considered in my medical decision making (see chart for details).  Patient well-appearing and in no acute distress.  Abdominal exam is quite reassuring.  Lab work is overall unremarkable, significant for microcytic anemia, counseled likely iron supplementation.  Urinalysis unremarkable, pregnancy test is negative.  Describes a mild burning in her epigastrium with some nausea.  Will treat with p.o. Zofran.  The patient states that she needs to leave to pick up her son she will return if any worsening.    ____________________________________________   FINAL CLINICAL IMPRESSION(S) / ED DIAGNOSES  Final diagnoses:  Acute gastritis without hemorrhage, unspecified gastritis type        Note:  This document was prepared using Dragon voice recognition software and may include unintentional dictation errors.    Lavonia Drafts, MD 12/22/17 2036

## 2017-12-22 NOTE — ED Triage Notes (Signed)
C/O generalized, intermittent abdominal pain x 3 days.  Denies Nausea, vomiting.  Patient is AAOx3.  Skin warm and dry. NAD.

## 2018-08-26 ENCOUNTER — Emergency Department
Admission: EM | Admit: 2018-08-26 | Discharge: 2018-08-26 | Disposition: A | Discharge status: Home / Self Care | Payer: BC Managed Care – PPO | Attending: Emergency Medicine | Admitting: Emergency Medicine

## 2018-08-26 ENCOUNTER — Other Ambulatory Visit: Payer: Self-pay

## 2018-08-26 ENCOUNTER — Encounter: Payer: Self-pay | Admitting: *Deleted

## 2018-08-26 DIAGNOSIS — Z5321 Procedure and treatment not carried out due to patient leaving prior to being seen by health care provider: Secondary | ICD-10-CM | POA: Diagnosis not present

## 2018-08-26 DIAGNOSIS — R110 Nausea: Secondary | ICD-10-CM | POA: Insufficient documentation

## 2018-08-26 LAB — URINALYSIS, COMPLETE (UACMP) WITH MICROSCOPIC
Bacteria, UA: NONE SEEN
Bilirubin Urine: NEGATIVE
Glucose, UA: NEGATIVE mg/dL
Hgb urine dipstick: NEGATIVE
Ketones, ur: NEGATIVE mg/dL
Leukocytes,Ua: NEGATIVE
Nitrite: NEGATIVE
Protein, ur: NEGATIVE mg/dL
Specific Gravity, Urine: 1.02 (ref 1.005–1.030)
pH: 6 (ref 5.0–8.0)

## 2018-08-26 LAB — CBC
HCT: 32.1 % — ABNORMAL LOW (ref 36.0–46.0)
Hemoglobin: 10.4 g/dL — ABNORMAL LOW (ref 12.0–15.0)
MCH: 23 pg — ABNORMAL LOW (ref 26.0–34.0)
MCHC: 32.4 g/dL (ref 30.0–36.0)
MCV: 70.9 fL — ABNORMAL LOW (ref 80.0–100.0)
Platelets: 413 10*3/uL — ABNORMAL HIGH (ref 150–400)
RBC: 4.53 MIL/uL (ref 3.87–5.11)
RDW: 18 % — ABNORMAL HIGH (ref 11.5–15.5)
WBC: 8.9 10*3/uL (ref 4.0–10.5)
nRBC: 0 % (ref 0.0–0.2)

## 2018-08-26 LAB — COMPREHENSIVE METABOLIC PANEL
ALT: 19 U/L (ref 0–44)
AST: 19 U/L (ref 15–41)
Albumin: 3.9 g/dL (ref 3.5–5.0)
Alkaline Phosphatase: 48 U/L (ref 38–126)
Anion gap: 13 (ref 5–15)
BUN: 9 mg/dL (ref 6–20)
CO2: 22 mmol/L (ref 22–32)
Calcium: 8.9 mg/dL (ref 8.9–10.3)
Chloride: 100 mmol/L (ref 98–111)
Creatinine, Ser: 0.86 mg/dL (ref 0.44–1.00)
GFR calc Af Amer: >60 mL/min (ref >60)
GFR calc non Af Amer: >60 mL/min (ref >60)
Glucose, Bld: 113 mg/dL — ABNORMAL HIGH (ref 70–99)
Potassium: 2.9 mmol/L — ABNORMAL LOW (ref 3.5–5.1)
Sodium: 135 mmol/L (ref 135–145)
Total Bilirubin: 0.4 mg/dL (ref 0.3–1.2)
Total Protein: 7 g/dL (ref 6.5–8.1)

## 2018-08-26 LAB — LIPASE, BLOOD: Lipase: 41 U/L (ref 11–51)

## 2018-08-26 LAB — POCT PREGNANCY, URINE: Preg Test, Ur: POSITIVE — AB

## 2018-08-26 NOTE — ED Triage Notes (Addendum)
Patient states she took a pregnancy test today which was positive. Patient is c/o nausea and lower abdominal pain. Patient denies vaginal discharge or bleeding.

## 2018-08-27 NOTE — ED Notes (Signed)
No answer when called for room x 3 

## 2018-09-20 ENCOUNTER — Ambulatory Visit (INDEPENDENT_AMBULATORY_CARE_PROVIDER_SITE_OTHER): Payer: BC Managed Care – PPO | Admitting: Obstetrics and Gynecology

## 2018-09-20 ENCOUNTER — Other Ambulatory Visit: Payer: Self-pay

## 2018-09-20 VITALS — BP 113/76 | HR 88 | Ht 67.0 in | Wt 169.6 lb

## 2018-09-20 DIAGNOSIS — Z369 Encounter for antenatal screening, unspecified: Secondary | ICD-10-CM

## 2018-09-20 NOTE — Progress Notes (Signed)
I have reviewed the record and concur with patient management and plan.  Gareth Fitzner, MD [facility]     

## 2018-09-20 NOTE — Progress Notes (Signed)
Jordan Holt presents for NOB nurse interview visit. Pregnancy confirmation done __8/16/2020. G3. [id number] Pregnancy education material explained and given. __0__ cats in home. NOB labs ordered. TSH/HbgA1c ordered due to BMI 30 or greater. Sickle cell ordered due to patient's race. HIV labs and drug screen were explained and ordered. PNV encouraged. Genetic screening options discussed. Genetic testing:Unsure. Patient may discuss with the provider. Patient to follow up with provider in __3__ weeks for NOB physical. All questions answered.

## 2018-09-21 LAB — VARICELLA ZOSTER ANTIBODY, IGG: Varicella zoster IgG: <135 index — ABNORMAL LOW (ref >165)

## 2018-09-21 LAB — URINALYSIS, ROUTINE W REFLEX MICROSCOPIC
Bilirubin, UA: NEGATIVE
Glucose, UA: NEGATIVE
Ketones, UA: NEGATIVE
Leukocytes,UA: NEGATIVE
Nitrite, UA: NEGATIVE
RBC, UA: NEGATIVE
Specific Gravity, UA: 1.029 (ref 1.005–1.030)
Urobilinogen, Ur: 1 mg/dL (ref 0.2–1.0)
pH, UA: 7 (ref 5.0–7.5)

## 2018-09-21 LAB — RPR: RPR Ser Ql: NONREACTIVE

## 2018-09-21 LAB — HIV ANTIBODY (ROUTINE TESTING W REFLEX): HIV Screen 4th Generation wRfx: NONREACTIVE

## 2018-09-21 LAB — ANTIBODY SCREEN: Antibody Screen: NEGATIVE

## 2018-09-21 LAB — HEPATITIS B SURFACE ANTIGEN: Hepatitis B Surface Ag: NEGATIVE

## 2018-09-21 LAB — RUBELLA SCREEN: Rubella Antibodies, IGG: 2.69 index (ref >0.99)

## 2018-09-21 LAB — ABO AND RH: Rh Factor: POSITIVE

## 2018-09-22 LAB — URINE CULTURE, OB REFLEX

## 2018-09-22 LAB — CULTURE, OB URINE

## 2018-09-23 LAB — GC/CHLAMYDIA PROBE AMP
Chlamydia trachomatis, NAA: NEGATIVE
Neisseria Gonorrhoeae by PCR: NEGATIVE

## 2018-10-16 ENCOUNTER — Other Ambulatory Visit (HOSPITAL_COMMUNITY)
Admission: RE | Admit: 2018-10-16 | Discharge: 2018-10-16 | Disposition: A | Discharge status: Home / Self Care | Payer: BC Managed Care – PPO | Source: Ambulatory Visit | Attending: Obstetrics and Gynecology | Admitting: Obstetrics and Gynecology

## 2018-10-16 ENCOUNTER — Encounter: Payer: Self-pay | Admitting: Obstetrics and Gynecology

## 2018-10-16 ENCOUNTER — Ambulatory Visit (INDEPENDENT_AMBULATORY_CARE_PROVIDER_SITE_OTHER): Payer: BC Managed Care – PPO | Admitting: Obstetrics and Gynecology

## 2018-10-16 ENCOUNTER — Ambulatory Visit (INDEPENDENT_AMBULATORY_CARE_PROVIDER_SITE_OTHER): Payer: BC Managed Care – PPO

## 2018-10-16 ENCOUNTER — Other Ambulatory Visit: Payer: Self-pay

## 2018-10-16 VITALS — BP 105/70 | HR 77 | Ht 67.0 in | Wt 168.3 lb

## 2018-10-16 DIAGNOSIS — Z3A12 12 weeks gestation of pregnancy: Secondary | ICD-10-CM

## 2018-10-16 DIAGNOSIS — K117 Disturbances of salivary secretion: Secondary | ICD-10-CM

## 2018-10-16 DIAGNOSIS — O99011 Anemia complicating pregnancy, first trimester: Secondary | ICD-10-CM

## 2018-10-16 DIAGNOSIS — O34219 Maternal care for unspecified type scar from previous cesarean delivery: Secondary | ICD-10-CM

## 2018-10-16 DIAGNOSIS — Z369 Encounter for antenatal screening, unspecified: Secondary | ICD-10-CM | POA: Diagnosis not present

## 2018-10-16 DIAGNOSIS — Z1379 Encounter for other screening for genetic and chromosomal anomalies: Secondary | ICD-10-CM

## 2018-10-16 DIAGNOSIS — Z124 Encounter for screening for malignant neoplasm of cervix: Secondary | ICD-10-CM | POA: Insufficient documentation

## 2018-10-16 DIAGNOSIS — Z283 Underimmunization status: Secondary | ICD-10-CM

## 2018-10-16 DIAGNOSIS — E876 Hypokalemia: Secondary | ICD-10-CM

## 2018-10-16 DIAGNOSIS — O09291 Supervision of pregnancy with other poor reproductive or obstetric history, first trimester: Secondary | ICD-10-CM

## 2018-10-16 DIAGNOSIS — Z8759 Personal history of other complications of pregnancy, childbirth and the puerperium: Secondary | ICD-10-CM

## 2018-10-16 DIAGNOSIS — O09899 Supervision of other high risk pregnancies, unspecified trimester: Secondary | ICD-10-CM | POA: Insufficient documentation

## 2018-10-16 DIAGNOSIS — O09891 Supervision of other high risk pregnancies, first trimester: Secondary | ICD-10-CM

## 2018-10-16 DIAGNOSIS — Z3481 Encounter for supervision of other normal pregnancy, first trimester: Secondary | ICD-10-CM

## 2018-10-16 LAB — POCT URINALYSIS DIPSTICK OB
Bilirubin, UA: NEGATIVE
Blood, UA: NEGATIVE
Glucose, UA: NEGATIVE
Ketones, UA: NEGATIVE
Leukocytes, UA: NEGATIVE
Nitrite, UA: NEGATIVE
Odor: NEGATIVE
POC,PROTEIN,UA: NEGATIVE
Spec Grav, UA: 1.01 (ref 1.010–1.025)
Urobilinogen, UA: 0.2 E.U./dL
pH, UA: 8 (ref 5.0–8.0)

## 2018-10-16 NOTE — Progress Notes (Signed)
OBSTETRIC INITIAL PRENATAL VISIT  Subjective:    Jordan Holt is being seen today for her first obstetrical visit.  This is not a planned pregnancy. She is a 33 y.o. [id number] female at [redacted]w[redacted]d gestation, Estimated Date of Delivery: 04/29/19 with Patient's last menstrual period was 07/23/2018, consistent with 12 week sono. Her obstetrical history is significant for history of PCOS, history of C-section x 1 (twin delivery for breech presentation  discovered later in labor due to unstable lie of Baby A). Relationship with FOB: spouse, living together. Patient does intend to breast feed. Pregnancy history fully reviewed.   OB History  Gravida Para Term Preterm AB Living  3 2 1 1  0 3  SAB TAB Ectopic Multiple Live Births  0 0 0 1 3    # Outcome Date GA Lbr Len/2nd Weight Sex Delivery Anes PTL Lv  3 Current           2A Preterm 10/08/15 [redacted]w[redacted]d  5 lb 10.5 oz (2.565 kg) M CS-LTranv Spinal  LIV     Name: Knoebel,BOYA Ninetta     Apgar1: 7  Apgar5: 8  2B Preterm 10/08/15 [redacted]w[redacted]d  5 lb 4.5 oz (2.395 kg) F CS-LTranv Spinal  LIV     Birth Comments: preterm     Name: Borntreger,GIRLB Makenzee     Apgar1: 8  Apgar5: 9  1 Term 12/19/12 [redacted]w[redacted]d 00:21 / 00:38 4 lb 11.7 oz (2.145 kg) [place]     Name: GYASI,BOY Virgie     Apgar1: 8  Apgar5: 9    Gynecologic History:  Last pap smear was 03/2015.  Results were normal.  Denies h/o abnormal pap smears in the past.  Denies history of STIs.  Contraception: none  Past Medical History:  Diagnosis Date  . PCOS (polycystic ovarian syndrome)     Family History  Problem Relation Age of Onset  . Diabetes Mother   . Alcohol abuse Neg Hx   . Arthritis Neg Hx   . Asthma Neg Hx   . Birth defects Neg Hx   . Cancer Neg Hx   . COPD Neg Hx   . Depression Neg Hx   . Drug abuse Neg Hx   . Early death Neg Hx   . Hearing loss Neg Hx   . Heart disease Neg Hx   . Hyperlipidemia Neg Hx   . Hypertension Neg Hx   . Kidney disease Neg Hx   . Learning  disabilities Neg Hx   . Mental illness Neg Hx   . Mental retardation Neg Hx   . Miscarriages / Stillbirths Neg Hx   . Stroke Neg Hx   . Vision loss Neg Hx     Past Surgical History:  Procedure Laterality Date  . CESAREAN SECTION MULTI-GESTATIONAL  10/08/2015   Procedure: CESAREAN SECTION MULTI-GESTATIONAL;  Surgeon: Osborne Oman, MD;  Location: [facility];  Service: Obstetrics;;  . NO PAST SURGERIES      Social History   Socioeconomic History  . Marital status: Married    Spouse name: Not on file  . Number of children: Not on file  . Years of education: Not on file  . Highest education level: Not on file  Occupational History  . Not on file  Social Needs  . Financial resource strain: Not on file  . Food insecurity    Worry: Not on file    Inability: Not on file  . Transportation needs    Medical: Not  on file    Non-medical: Not on file  Tobacco Use  . Smoking status: Never Smoker  . Smokeless tobacco: Never Used  Substance and Sexual Activity  . Alcohol use: No  . Drug use: No  . Sexual activity: Yes    Birth control/protection: None  Lifestyle  . Physical activity    Days per week: Not on file    Minutes per session: Not on file  . Stress: Not on file  Relationships  . Social Herbalist on phone: Not on file    Gets together: Not on file    Attends religious service: Not on file    Active member of club or organization: Not on file    Attends meetings of clubs or organizations: Not on file    Relationship status: Not on file  . Intimate partner violence    Fear of current or ex partner: Not on file    Emotionally abused: Not on file    Physically abused: Not on file    Forced sexual activity: Not on file  Other Topics Concern  . Not on file  Social History Narrative  . Not on file    No current outpatient medications on file prior to visit.   No current facility-administered medications on file prior to visit.     No Known  Allergies    Review of Systems General: Not Present- Fever, Weight Loss and Weight Gain. Skin: Not Present- Rash. HEENT: Not Present- Blurred Vision, Headache and Bleeding Gums.  Positive for increased saliva production.  Respiratory: Not Present- Difficulty Breathing. Breast: Not Present- Breast Mass. Cardiovascular: Not Present- Chest Pain, Elevated Blood Pressure, Fainting / Blacking Out and Shortness of Breath. Gastrointestinal: Not Present- Abdominal Pain, Constipation, Nausea and Vomiting. Female Genitourinary: Not Present- Frequency, Painful Urination, Pelvic Pain, Vaginal Bleeding, Vaginal Discharge, Contractions, regular, Fetal Movements Decreased, Urinary Complaints and Vaginal Fluid. Musculoskeletal: Not Present- Back Pain and Leg Cramps. Neurological: Not Present- Dizziness. Psychiatric: Not Present- Depression.     Objective:   Blood pressure 105/70, pulse 77, weight 168 lb 4.8 oz (76.3 kg), last menstrual period 07/23/2018, not currently breastfeeding.  Body mass index is 26.36 kg/m.  General Appearance:    Alert, cooperative, no distress, appears stated age  Head:    Normocephalic, without obvious abnormality, atraumatic  Eyes:    PERRL, conjunctiva/corneas clear, EOM's intact, both eyes  Ears:    Normal external ear canals, both ears  Nose:   Nares normal, septum midline, mucosa normal, no drainage or sinus tenderness  Throat:   Lips, mucosa, and tongue normal; teeth and gums normal  Neck:   Supple, symmetrical, trachea midline, no adenopathy; thyroid: no enlargement/tenderness/nodules; no carotid bruit or JVD  Back:     Symmetric, no curvature, ROM normal, no CVA tenderness  Lungs:     Clear to auscultation bilaterally, respirations unlabored  Chest Wall:    No tenderness or deformity   Heart:    Regular rate and rhythm, S1 and S2 normal, no murmur, rub or gallop  Breast Exam:    No tenderness, masses, or nipple abnormality  Abdomen:     Soft, non-tender, bowel  sounds active all four quadrants, no masses, no organomegaly.  FH 12.  FHT 180 bpm (by today's dating scan).  Genitalia:    Pelvic:external genitalia normal, vagina without lesions, discharge, or tenderness, rectovaginal septum  normal. Cervix normal in appearance, no cervical motion tenderness, no adnexal masses or tenderness.  Pregnancy positive  findings: uterine enlargement: 12 wk size, nontender.   Rectal:    Normal external sphincter.  No hemorrhoids appreciated. Internal exam not done.   Extremities:   Extremities normal, atraumatic, no cyanosis or edema  Pulses:   2+ and symmetric all extremities  Skin:   Skin color, texture, turgor normal, no rashes or lesions  Lymph nodes:   Cervical, supraclavicular, and axillary nodes normal  Neurologic:   CNII-XII intact, normal strength, sensation and reflexes throughout      Assessment:    Pregnancy at 12 and 1/7 weeks  Cervical cancer screening Genetic screening Hypokalemia Anemia affecting pregnancy in third trimester  History of cesarean section complicating pregnancy History of twin pregnancy in prior pregnancy Ptyalism Varicella non-immune  Plan:    Initial labs reviewed.  Varicella non-immune. Will need vaccine postpartum.  Pap smear performed today.  Prenatal vitamins encouraged. Problem list reviewed and updated. New OB counseling:  The patient has been given an overview regarding routine prenatal care.  Recommendations regarding diet, weight gain, and exercise in pregnancy were given. Prenatal testing, optional genetic testing, and ultrasound use in pregnancy were reviewed. Patient desires cell-free DNA testing with [email or web address].  Benefits of Breast Feeding were discussed. The patient is encouraged to consider nursing her baby post partum. Notes she breastfeed her other children for almost 2 years.  Counseled regarding TOLAC vs RCS; risks/benefits discussed in detail. All questions answered.  Patient elects for TOLAC, score  calculated at 75.5%.  Good candidate for TOLAC.  H/o hypokalemia and mild anemia noted in August at ED visit, will repeat labs today.  For flu vaccine next visit.  Ptyalism, if not improved by second trimester, can consider use of scopolamine patch.   Follow up in 4 weeks.  50% of 30 min visit spent on counseling and coordination of care.      Rubie Maid, MD [facility]

## 2018-10-16 NOTE — Patient Instructions (Signed)

## 2018-10-16 NOTE — Progress Notes (Signed)
NOB-no complaints. RSB- done. Declines Flu vaccine. Last pap 01/21/2013- neg.

## 2018-10-17 LAB — COMPREHENSIVE METABOLIC PANEL
ALT: 26 IU/L (ref 0–32)
AST: 15 IU/L (ref 0–40)
Albumin/Globulin Ratio: 1.5 (ref 1.2–2.2)
Albumin: 4 g/dL (ref 3.8–4.8)
Alkaline Phosphatase: 62 IU/L (ref 39–117)
BUN/Creatinine Ratio: 10 (ref 9–23)
BUN: 6 mg/dL (ref 6–20)
Bilirubin Total: <0.2 mg/dL (ref 0.0–1.2)
CO2: 21 mmol/L (ref 20–29)
Calcium: 9.8 mg/dL (ref 8.7–10.2)
Chloride: 102 mmol/L (ref 96–106)
Creatinine, Ser: 0.63 mg/dL (ref 0.57–1.00)
GFR calc Af Amer: 136 mL/min/{1.73_m2} (ref >59)
GFR calc non Af Amer: 118 mL/min/{1.73_m2} (ref >59)
Globulin, Total: 2.6 g/dL (ref 1.5–4.5)
Glucose: 87 mg/dL (ref 65–99)
Potassium: 3.6 mmol/L (ref 3.5–5.2)
Sodium: 137 mmol/L (ref 134–144)
Total Protein: 6.6 g/dL (ref 6.0–8.5)

## 2018-10-17 LAB — CBC
Hematocrit: 33.4 % — ABNORMAL LOW (ref 34.0–46.6)
Hemoglobin: 10.7 g/dL — ABNORMAL LOW (ref 11.1–15.9)
MCH: 23.2 pg — ABNORMAL LOW (ref 26.6–33.0)
MCHC: 32 g/dL (ref 31.5–35.7)
MCV: 73 fL — ABNORMAL LOW (ref 79–97)
Platelets: 427 10*3/uL (ref 150–450)
RBC: 4.61 x10E6/uL (ref 3.77–5.28)
RDW: 16.7 % — ABNORMAL HIGH (ref 11.7–15.4)
WBC: 8.9 10*3/uL (ref 3.4–10.8)

## 2018-10-20 LAB — MATERNIT21  PLUS CORE+ESS+SCA, BLOOD

## 2018-10-25 ENCOUNTER — Other Ambulatory Visit: Payer: Self-pay

## 2018-10-25 DIAGNOSIS — K117 Disturbances of salivary secretion: Secondary | ICD-10-CM

## 2018-10-25 MED ORDER — IRON 325 (65 FE) MG PO TABS: 325.0000 mg | ORAL_TABLET | Freq: Every day | ORAL | 6 refills | Status: DC

## 2018-10-30 DIAGNOSIS — K117 Disturbances of salivary secretion: Secondary | ICD-10-CM | POA: Insufficient documentation

## 2018-10-30 LAB — CYTOLOGY - PAP
Comment: NEGATIVE
Diagnosis: UNDETERMINED — AB
High risk HPV: NEGATIVE

## 2018-10-30 MED ORDER — SCOPOLAMINE 1 MG/3DAYS TD PT72: 1.0000 | MEDICATED_PATCH | TRANSDERMAL | 12 refills | Status: DC

## 2018-11-02 ENCOUNTER — Other Ambulatory Visit: Payer: Self-pay

## 2018-11-02 MED ORDER — SCOPOLAMINE 1 MG/3DAYS TD PT72: 1.0000 | MEDICATED_PATCH | TRANSDERMAL | 12 refills | Status: DC

## 2018-11-05 MED ORDER — SCOPOLAMINE 1 MG/3DAYS TD PT72: 1.0000 | MEDICATED_PATCH | TRANSDERMAL | 12 refills | Status: DC

## 2018-11-05 NOTE — Addendum Note (Signed)
Addended by: Augusto Gamble on: 11/05/2018 08:03 AM   Modules accepted: Orders

## 2018-11-13 ENCOUNTER — Encounter: Payer: Self-pay | Admitting: Obstetrics and Gynecology

## 2018-11-13 ENCOUNTER — Other Ambulatory Visit: Payer: Self-pay

## 2018-11-13 ENCOUNTER — Ambulatory Visit (INDEPENDENT_AMBULATORY_CARE_PROVIDER_SITE_OTHER): Payer: BC Managed Care – PPO | Admitting: Obstetrics and Gynecology

## 2018-11-13 VITALS — BP 108/73 | HR 92 | Ht 67.0 in | Wt 168.3 lb

## 2018-11-13 DIAGNOSIS — Z3482 Encounter for supervision of other normal pregnancy, second trimester: Secondary | ICD-10-CM

## 2018-11-13 DIAGNOSIS — Z3A16 16 weeks gestation of pregnancy: Secondary | ICD-10-CM

## 2018-11-13 LAB — POCT URINALYSIS DIPSTICK OB
Bilirubin, UA: NEGATIVE
Blood, UA: NEGATIVE
Glucose, UA: NEGATIVE
Ketones, UA: NEGATIVE
Leukocytes, UA: NEGATIVE
Nitrite, UA: NEGATIVE
POC,PROTEIN,UA: NEGATIVE
Spec Grav, UA: 1.01
Urobilinogen, UA: 0.2 U/dL
pH, UA: 6.5

## 2018-11-13 NOTE — Addendum Note (Signed)
Addended by: Durwin Glaze on: 11/13/2018 04:07 PM   Modules accepted: Orders

## 2018-11-13 NOTE — Progress Notes (Signed)
ROB: No complaints.  aFP today.  FAS next visit.

## 2018-11-15 LAB — AFP, SERUM, OPEN SPINA BIFIDA
AFP MoM: 2.26
AFP Value: 76.6 ng/mL
Gest. Age on Collection Date: 16 weeks
Maternal Age At EDD: 33.9 yr
OSBR Risk 1 IN: 925
Test Results:: NEGATIVE
Weight: 168 [lb_av]

## 2018-12-12 ENCOUNTER — Ambulatory Visit (INDEPENDENT_AMBULATORY_CARE_PROVIDER_SITE_OTHER): Payer: BC Managed Care – PPO | Admitting: Obstetrics and Gynecology

## 2018-12-12 ENCOUNTER — Other Ambulatory Visit: Payer: Self-pay

## 2018-12-12 ENCOUNTER — Ambulatory Visit (INDEPENDENT_AMBULATORY_CARE_PROVIDER_SITE_OTHER): Payer: BC Managed Care – PPO

## 2018-12-12 ENCOUNTER — Encounter: Payer: Self-pay | Admitting: Obstetrics and Gynecology

## 2018-12-12 VITALS — BP 114/73 | HR 83 | Wt 172.2 lb

## 2018-12-12 DIAGNOSIS — O26899 Other specified pregnancy related conditions, unspecified trimester: Secondary | ICD-10-CM

## 2018-12-12 DIAGNOSIS — Z3482 Encounter for supervision of other normal pregnancy, second trimester: Secondary | ICD-10-CM

## 2018-12-12 DIAGNOSIS — O26892 Other specified pregnancy related conditions, second trimester: Secondary | ICD-10-CM

## 2018-12-12 DIAGNOSIS — R102 Pelvic and perineal pain: Secondary | ICD-10-CM

## 2018-12-12 DIAGNOSIS — Z3A20 20 weeks gestation of pregnancy: Secondary | ICD-10-CM

## 2018-12-12 DIAGNOSIS — Z3689 Encounter for other specified antenatal screening: Secondary | ICD-10-CM

## 2018-12-12 LAB — POCT URINALYSIS DIPSTICK OB
Bilirubin, UA: NEGATIVE
Blood, UA: NEGATIVE
Glucose, UA: NEGATIVE
Ketones, UA: NEGATIVE
Leukocytes, UA: NEGATIVE
Nitrite, UA: NEGATIVE
Spec Grav, UA: 1.01 (ref 1.010–1.025)
Urobilinogen, UA: 0.2 E.U./dL
pH, UA: 7 (ref 5.0–8.0)

## 2018-12-12 NOTE — Progress Notes (Signed)
ROB: Notes intermittent lower abdominal pain off and on, mostly noted on days where she works more or does strenuous activity. Advised on resting as needed, Tylenol prn. Anatomy scan noted, incomplete (views of spine) and growth lag of 3 weeks.  Will refer to MFM for f/u growth scan and completion of anatomy. Patient does report that all of her babies were relatively small at birth (4-5 lbs), but never had issues with growth during the early parts of pregnancies. RTC in 4 weeks for routine OB visit.   The following were addressed during this visit:  Breastfeeding Education - Early initiation of breastfeeding    Comments: Keeps milk supply adequate, helps contract uterus and slow bleeding, and early milk is the perfect first food and is easy to digest.   - The importance of exclusive breastfeeding    Comments: Provides antibodies, Lower risk of breast and ovarian cancers, and type-2 diabetes,Helps your body recover, Reduced chance of SIDS.   - Risks of giving your baby anything other than breast milk if you are breastfeeding    Comments: Make the baby less content with breastfeeds, may make my baby more susceptible to illness, and may reduce my milk supply.   - Feeding on demand or baby-led feeding    Comments: Helps prevent breastfeeding complications, helps bring in good milk supply, prevents under or overfeeding, and helps baby feel content and satisfied   - Frequent feeding to help assure optimal milk production    Comments: Making a full supply of milk requires frequent removal of milk from breasts, infant will eat 8-12 times in 24 hours, if separated from infant use breast massage, hand expression and/ or pumping to remove milk from breasts.   - Exclusive breastfeeding for the first 6 months    Comments: Builds a healthy milk supply and keeps it up, protects baby from sickness and disease, and breastmilk has everything your baby needs for the first 6 months.

## 2018-12-12 NOTE — Progress Notes (Signed)
ROB-Pt for routine prenatal care and anatomy scan. Pt stated having lower abd pains off and on. No other problems or concerns.

## 2018-12-13 ENCOUNTER — Other Ambulatory Visit: Payer: Self-pay | Admitting: Obstetrics and Gynecology

## 2018-12-13 DIAGNOSIS — Z3689 Encounter for other specified antenatal screening: Secondary | ICD-10-CM

## 2018-12-17 ENCOUNTER — Other Ambulatory Visit: Payer: Self-pay

## 2018-12-17 DIAGNOSIS — O365990 Maternal care for other known or suspected poor fetal growth, unspecified trimester, not applicable or unspecified: Secondary | ICD-10-CM

## 2018-12-20 ENCOUNTER — Ambulatory Visit
Admission: RE | Admit: 2018-12-20 | Discharge: 2018-12-20 | Disposition: A | Discharge status: Home / Self Care | Payer: BC Managed Care – PPO | Source: Ambulatory Visit | Attending: Obstetrics and Gynecology | Admitting: Obstetrics and Gynecology

## 2018-12-20 ENCOUNTER — Other Ambulatory Visit: Payer: Self-pay

## 2018-12-20 DIAGNOSIS — Z3A21 21 weeks gestation of pregnancy: Secondary | ICD-10-CM | POA: Diagnosis not present

## 2018-12-20 DIAGNOSIS — O365990 Maternal care for other known or suspected poor fetal growth, unspecified trimester, not applicable or unspecified: Secondary | ICD-10-CM | POA: Insufficient documentation

## 2018-12-20 DIAGNOSIS — O365920 Maternal care for other known or suspected poor fetal growth, second trimester, not applicable or unspecified: Secondary | ICD-10-CM

## 2018-12-20 DIAGNOSIS — Z3689 Encounter for other specified antenatal screening: Secondary | ICD-10-CM | POA: Insufficient documentation

## 2018-12-20 DIAGNOSIS — Z8759 Personal history of other complications of pregnancy, childbirth and the puerperium: Secondary | ICD-10-CM | POA: Insufficient documentation

## 2018-12-20 DIAGNOSIS — Z3687 Encounter for antenatal screening for uncertain dates: Secondary | ICD-10-CM | POA: Diagnosis present

## 2018-12-20 DIAGNOSIS — O09299 Supervision of pregnancy with other poor reproductive or obstetric history, unspecified trimester: Secondary | ICD-10-CM | POA: Insufficient documentation

## 2018-12-20 DIAGNOSIS — O34219 Maternal care for unspecified type scar from previous cesarean delivery: Secondary | ICD-10-CM

## 2018-12-20 NOTE — Progress Notes (Signed)
[facility] Consultation   Chief Complaint: Fetal growth restriction in the second trimester  HPI: [name] is a 33 y.o. 123456 at [redacted]w[redacted]d by certain LMP on 0000000 and confirmed by 12-week 0d ultrasound [facility] 10[facility]16/2018 who presents in consultation from  [facility] for fetal growth restriction in the second trimester. Patient had neg  cell free DNA screening with a maternity 21 test Y detected.  She had a normal MSAFP test at 2.2 MOM's.  Patient's first pregnancy was a live birth at [facility] fetus weighed 4 pounds 11 ounces 2145 g Apgars 8 9 he is a healthy 57-year-old female he had some neonatal jaundice.  On review his of his pathology placenta weighed  374 g so smaller than average with no significant pathology. Review of this discharge summary reveals she had preeclampsia with this delivery requiring magnesium sulfate The patient delivered twins by low-transverse cesarean section for breech breech presentation in 2017 at 36 weeks review of the discharge summary show she had preeclampsia intraoperative hemorrhage and transfusion of 3 units of packed red cells she required 24 hours of magnesium sulfate and was discharged home on hydrochlorothiazide and Norvasc.  Infant weights were not listed on the discharge  Past Medical History: Patient  has a past medical history of Anemia and PCOS (polycystic ovarian syndrome).  Past Surgical History: She  has a past surgical history that includes No past surgeries and Cesarean section multi-gestational (10/08/2015).  Obstetric History:  OB History    Gravida  3   Para  2   Term  1   Preterm  1   AB      Living  3     SAB      TAB      Ectopic      Multiple  1   Live Births  3          Gynecologic History:  Patient's last menstrual period was 07/23/2018.    Medications:  Current Outpatient Medications:  .  Ferrous Sulfate (IRON) 325 (65 Fe) MG TABS, Take 1 tablet (325 mg total) by  mouth daily., Disp: 30 tablet, Rfl: 6 .  Prenatal Vit-Fe Fumarate-FA (MULTIVITAMIN-PRENATAL) 27-0.8 MG TABS tablet, Take 1 tablet by mouth daily at 12 noon., Disp: , Rfl:   Allergies: Patient has No Known Allergies.  Social History: Patient  reports that she has never smoked. She has never used smokeless tobacco. She reports that she does not drink alcohol or use drugs.  Family History: family history includes Diabetes in her mother; Healthy in her father.  Review of Systems A full 12 point review of systems was negative or as noted in the History of Present Illness.  Physical Exam: BP 120/78   Pulse 89   Temp 97.7 F (36.5 C)   Wt 78.9 kg   LMP 07/23/2018   BMI 27.25 kg/m    Asessement: 1. Encounter for fetal anatomic survey   2. Hx of intrauterine growth restriction in prior pregnancy, currently pregnant   3. Pregnancy affected by fetal growth restriction   4 h/o cesarean section 5H/o preeclampsia x 2  Plan: I reviewed the patient's history and discussed her care related to the findings of the ultrasound today.  Today the fetus is lagging by 23 days at [place] and 3d the fetus is measuring 18 weeks and 1 day with a thickened abnormal placenta.  The fetus may have some echogenic bowel and there may be hypoplasia of the nasal  bone though with African ancestry this could be a normal variant. I reviewed with her this could be a primary placental problem with just early severe fetal growth restriction, this could represent chromosomal abnormality missed by maternity 21 such as triploidy or could be another cause patient denies exposures or unusual infections.  Interestingly her singleton and set of twins were all small fetuses though had a 23-day lag at 21 weeks I think this is less likely to be constitutional.  I offered her an amniocentesis which she declined.  I told her amniocentesis would help [place] pick up abnormalities that when turning 21 might have missed we could also diagnose triploidy.   Likewise we could also check for infectious causes given I did not see any calcifications I on the fetus I did not spend much time discussing infectious causes of growth restriction.  The patient reports no severe illnesses in the early part of her pregnancy.  She has had preeclampsia twice when option is to also check for antiphospholipid antibody labs I did not offer that today.  Likewise given her history of preeclampsia x2 I would offer her a baby aspirin daily we did not discuss that today. I reviewed the findings with her husband who is a professor of math at [employer] by phone.  I offered her 5 options: 1 I told her we would follow the fetus closely we would reassess growth in 2 weeks we can continue expectant management -I did not perform Dopplers today, we can do this in the future as it might suggest whether this is a placental growth restriction type condition.  A follow-up ultrasound was ordered in 2 weeks 2 I offered her further diagnosis with amniocentesis-  I did quote her a higher loss rate( than the 1 and 200 we usually quote) given the fetus' poor growth currently.  I told her amniocentesis would give her  a faster result on whether triploidy is involved or other chromosomal abnormality.  We could also do infectious studies of the amniotic fluid 3 I offered her further diagnosis with cell free DNA with panorama which has a quotation of a high sensitivity for picking up triploidy- the patient wanted to proceed with this and it was drawn. 4 second opinion was offered, I recommended that she go to a tertiary care center such as [facility] or [facility] if she desired another opinion. 5.  I reviewed with her that termination of pregnancy is an option, given her  LMP she may need to pursue this [place] even though today she measures 18 weeks.  This is a dramatic lack of growth at 21 weeks and may carry a poor prognosis I told her she may be at higher risk of preeclampsia  given the abnormal placentation.  I did not discuss with her starting a baby aspirin or obtaining antiphospholipid antibody labs but these are also reasonable avenues to pursue        Total time spent with the patient was 30 minutes with greater than 50% spent in counseling and coordination of care. We appreciate this interesting consult and will be happy to be involved in the ongoing care of Jordan Holt in anyway her obstetricians desire.  Ajani Rineer, [facility]

## 2018-12-24 ENCOUNTER — Inpatient Hospital Stay
Admission: EM | Admit: 2018-12-24 | Discharge: 2018-12-24 | Disposition: A | Discharge status: Home / Self Care | Payer: BC Managed Care – PPO | Attending: Obstetrics and Gynecology | Admitting: Obstetrics and Gynecology

## 2018-12-24 NOTE — OB Triage Note (Signed)
Pt. presented to L&D due to absence of fetal movement. FHT obtained at 173. Advised that absence of consistent movement at [redacted] weeks gestation was not abnormal. Maternal vital signs also obtained and were WNL. Pt. discharged home according to provider's orders.

## 2018-12-26 ENCOUNTER — Other Ambulatory Visit: Payer: Self-pay

## 2018-12-26 ENCOUNTER — Telehealth: Payer: Self-pay

## 2018-12-26 DIAGNOSIS — O365920 Maternal care for other known or suspected poor fetal growth, second trimester, not applicable or unspecified: Secondary | ICD-10-CM

## 2018-12-26 NOTE — Telephone Encounter (Signed)
Pt called checking status of [facility] referral appt. Pt making sure it is for [facility] in [place]. Pt wanting second opinion after seeing [facility].

## 2018-12-26 NOTE — Telephone Encounter (Signed)
Pt aware appt made for 12/28 at 7:30 for scan and see Dr. Donalee Citrin at 8:30.  [facility].  Address and phone number given to pt.  U/s ordered for [facility] to sign.   Heather at [facility] made appt.

## 2018-12-27 ENCOUNTER — Telehealth: Payer: Self-pay | Admitting: Obstetrics and Gynecology

## 2018-12-27 ENCOUNTER — Other Ambulatory Visit: Payer: Self-pay

## 2018-12-27 DIAGNOSIS — O365990 Maternal care for other known or suspected poor fetal growth, unspecified trimester, not applicable or unspecified: Secondary | ICD-10-CM

## 2018-12-27 NOTE — Telephone Encounter (Signed)
The patient was informed of the results of her recent Panorama testing which yielded NEGATIVE results.  The patient's specimen showed DNA consistent with two copies of chromosomes 21, 18 and 13.  The sensitivity for trisomy 87, trisomy 86 and trisomy 50 using this testing are reported as >99%, 98.2% and >99% respectively.  Thus, while the results of this testing are highly accurate, they are not considered diagnostic at this time.  Should more definitive information be desired, the patient may still consider amniocentesis. This testing also assessed risk for 45,X (Turner syndrome) and was negative with a 94.7% sensitivity.  The reason this particular testing was offered was also to determine the risk for triploidy given the ultrasound findings on 12/20/2018 which showed growth restriction and an abnormal placenta.  The results of the Panorama were also negative for triploidy (sensitivity >99%, negative predictive value >99%).  We reviewed again that this is considered a screening test and that amniocentesis is still available if desired to more thoroughly evaluate for chromosome conditions with a karyotype and microarray as well as infectious studies.  The patient expressed that she does not desire amniocentesis at this time and would like to speak with her husband further about this option.  They are scheduled to return to [facility] on 12/31/2018 for a follow up ultrasound and she stated that she is scheduled for a second opinion at [facility] on 12/28/202.  She is aware that she may discuss the options of amniocentesis as well as termination if she has questions at either of those visits.  As requested to know by the patient, sex chromosome analysis was included for this sample.  Results are consistent with a female fetus. This is predicted with >99% accuracy.  A maternal serum AFP only should be considered if screening for neural tube defects is desired.  We may be reached at [phone]  with any questions or concerns.   Wilburt Finlay, MS, CGC

## 2018-12-31 ENCOUNTER — Other Ambulatory Visit: Payer: Self-pay

## 2018-12-31 ENCOUNTER — Other Ambulatory Visit: Payer: Self-pay | Admitting: Maternal & Fetal Medicine

## 2018-12-31 ENCOUNTER — Ambulatory Visit
Admission: RE | Admit: 2018-12-31 | Discharge: 2018-12-31 | Disposition: A | Discharge status: Home / Self Care | Payer: BC Managed Care – PPO | Source: Ambulatory Visit | Attending: Maternal & Fetal Medicine | Admitting: Maternal & Fetal Medicine

## 2018-12-31 DIAGNOSIS — Z3A23 23 weeks gestation of pregnancy: Secondary | ICD-10-CM | POA: Insufficient documentation

## 2018-12-31 DIAGNOSIS — O365920 Maternal care for other known or suspected poor fetal growth, second trimester, not applicable or unspecified: Secondary | ICD-10-CM

## 2018-12-31 DIAGNOSIS — O365990 Maternal care for other known or suspected poor fetal growth, unspecified trimester, not applicable or unspecified: Secondary | ICD-10-CM | POA: Diagnosis present

## 2019-01-01 LAB — LUPUS ANTICOAGULANT PANEL
DRVVT: 32.2 s (ref 0.0–47.0)
PTT Lupus Anticoagulant: 30.5 s (ref 0.0–51.9)

## 2019-01-01 LAB — BETA-2-GLYCOPROTEIN I ABS, IGG/M/A
Beta-2 Glyco I IgG: <9 GPI IgG units (ref 0–20)
Beta-2-Glycoprotein I IgA: <9 GPI IgA units (ref 0–25)
Beta-2-Glycoprotein I IgM: <9 GPI IgM units (ref 0–32)

## 2019-01-02 LAB — CARDIOLIPIN ANTIBODIES, IGG, IGM, IGA
Anticardiolipin IgA: <9 APL U/mL (ref 0–11)
Anticardiolipin IgG: <9 GPL U/mL (ref 0–14)
Anticardiolipin IgM: <9 MPL U/mL (ref 0–12)

## 2019-01-07 ENCOUNTER — Other Ambulatory Visit (HOSPITAL_COMMUNITY): Payer: Self-pay | Admitting: *Deleted

## 2019-01-07 ENCOUNTER — Ambulatory Visit (HOSPITAL_COMMUNITY): Payer: BC Managed Care – PPO

## 2019-01-07 ENCOUNTER — Encounter (HOSPITAL_COMMUNITY): Payer: Self-pay

## 2019-01-07 ENCOUNTER — Inpatient Hospital Stay (HOSPITAL_COMMUNITY)
Admission: AD | Admit: 2019-01-07 | Discharge: 2019-01-07 | Disposition: A | Discharge status: Home / Self Care | Payer: BC Managed Care – PPO | Attending: Obstetrics and Gynecology | Admitting: Obstetrics and Gynecology

## 2019-01-07 ENCOUNTER — Other Ambulatory Visit: Payer: Self-pay | Admitting: Obstetrics and Gynecology

## 2019-01-07 ENCOUNTER — Other Ambulatory Visit: Payer: Self-pay

## 2019-01-07 ENCOUNTER — Ambulatory Visit (HOSPITAL_COMMUNITY)
Admission: RE | Admit: 2019-01-07 | Discharge: 2019-01-07 | Disposition: A | Discharge status: Home / Self Care | Payer: BC Managed Care – PPO | Source: Ambulatory Visit | Attending: Obstetrics and Gynecology | Admitting: Obstetrics and Gynecology

## 2019-01-07 ENCOUNTER — Ambulatory Visit (HOSPITAL_COMMUNITY): Payer: BC Managed Care – PPO | Admitting: *Deleted

## 2019-01-07 VITALS — BP 122/84 | HR 92 | Temp 97.7°F

## 2019-01-07 DIAGNOSIS — Z3A24 24 weeks gestation of pregnancy: Secondary | ICD-10-CM | POA: Insufficient documentation

## 2019-01-07 DIAGNOSIS — O359XX0 Maternal care for (suspected) fetal abnormality and damage, unspecified, not applicable or unspecified: Secondary | ICD-10-CM

## 2019-01-07 DIAGNOSIS — O3412 Maternal care for benign tumor of corpus uteri, second trimester: Secondary | ICD-10-CM | POA: Diagnosis not present

## 2019-01-07 DIAGNOSIS — O34219 Maternal care for unspecified type scar from previous cesarean delivery: Secondary | ICD-10-CM | POA: Diagnosis not present

## 2019-01-07 DIAGNOSIS — O365920 Maternal care for other known or suspected poor fetal growth, second trimester, not applicable or unspecified: Secondary | ICD-10-CM | POA: Diagnosis present

## 2019-01-07 DIAGNOSIS — D259 Leiomyoma of uterus, unspecified: Secondary | ICD-10-CM

## 2019-01-07 DIAGNOSIS — O0990 Supervision of high risk pregnancy, unspecified, unspecified trimester: Secondary | ICD-10-CM | POA: Diagnosis present

## 2019-01-07 DIAGNOSIS — O365990 Maternal care for other known or suspected poor fetal growth, unspecified trimester, not applicable or unspecified: Secondary | ICD-10-CM

## 2019-01-07 DIAGNOSIS — O09212 Supervision of pregnancy with history of pre-term labor, second trimester: Secondary | ICD-10-CM

## 2019-01-07 NOTE — MAU Note (Signed)
NICU doc paged by NP, message left.  CNM spoke with pt.  Plan discussed, pt to speak w/ Neonatologist and OB.  Aware the dr was paged.

## 2019-01-07 NOTE — Consult Note (Signed)
Maternal-Fetal Medicine   Name: Jordan Holt MRN: [id number] Requesting Provider: Dr. Rubie Maid, MD  I the pleasure of seeing [name] today at the [facility].  She is here for ultrasound and consultation because of early onset fetal growth restriction. Her pregnancy is well dated by sure LMP that is consistent with 12-week ultrasound performed at your office.  Subsequently she had fetal anatomy on follow-up ultrasound had consultation with MFM ([facility]). Fetal growth restriction was diagnosed and the patient was appropriately counseled. On cell free fetal DNA screening, the risks of fetal aneuploidies are not increased.  MSAFP screening showed low risk for open neural tube defects.  Past medical history: No history of diabetes or hypertension or any other chronic medical conditions.  Patient has anemia and takes iron supplements. Past surgical history: Cesarean section. Medications: Prenatal vitamins, iron supplements. Allergies: No known drug allergies. Social history: Denies tobacco, drug or alcohol use.  Patient is married and her husband is in good health.  Both are from [place] and emigrated to the [place] in 2010. Family history: No history of venous thromboembolism in the family. Obstetric history: -12/2012: Term vaginal delivery of a female infant weighing 214 5 g at birth.  Her son is in good health -09/2015: Preterm cesarean delivery of twins.  Patient went into spontaneous labor at [redacted] weeks gestation and had a cesarean delivery (presenting twin nonvertex).  Female infant weighing 239 5 g and a female infant weighing 256 5 g were delivered.  Both her children are in good health. GYN history: No history of abnormal Pap smears or cervical surgeries.  BP at our office is 122/84 mmHg.  Ultrasound We performed a fetal anatomical survey.  Composite fetal biometry lacks gestational age by 27 weeks.  The estimated fetal weight is 224 g (8 ounces).  The head and abdominal  circumference measurements, female length and long bones all lag gestational age by 4 to 6 weeks.  Amniotic fluid is normal and good fetal activity seen.  Umbilical artery Doppler showed intermittent reversed end-diastolic flow. Placenta is fundal and had multiple lacunae.  Placental myometrial interface appears normal.  I do not suspect placenta accreta.  I counseled the patient on the following (and her husband on the phone): Early onset fetal growth restriction -Possible causes of fetal growth restriction include placental insufficiency, fetal chromosomal anomalies or genetic syndromes. Placental insufficiency is the most-likely cause. Fetal infection is a less-likely cause. Patient does not give history of fever or rashes. In women who develop preeclampsia later in pregnancy, growth restriction may manifest earlier.  -Amniocentesis will be able to confirm chromosomal anomalies or some (not all) genetic syndromes. I discussed the procedure and its possible complications including miscarriage/preterm delivery (1 in 500 procedures).  -Explained viability (23 to 24 weeks).   -Preterm delivery of a severely-growth restricted babies have a very high perinatal mortality and morbidity rates.  -Patient has the option of decreasing the intervals of monitoring (weekly) till 26 to 28 weeks when there is a greater likelihood of survival. Intensive monitoring early in gestational age also increases the likelihood of classical cesarean section. Alternatively, the patient may choose not to monitor till 28 weeks.  -I recommended neonatology consultation now to have a better understanding of prognosis after birth.   -Antenatal corticosteroids help in fetal lung maturity especially if administered about a week before delivery. Steroids may be withheld if delivery is not planned now.  -I explained the role of umbilical artery Doppler surveillance.  -  Sudden fetal death can occur even if aggressive fetal  surveillance is adopted. Patient understands the limitations of ultrasound in detecting fetal compromise.  Patient opted not to have amniocentesis. She agreed to meet with our neonatologist today. Patient will be going over to the MAU for neonatology consultation today.  Further follow-up and management will be based on patient's decision after neonatal consultation.  Patient later had neonatology consultation (Dr. Clinton Holt) today at the MAU. She opted for expectant management and would consider intervention (delivery) if the fetal weight is 400 to 500 grams.   Recommendations: -Umbilical artery Doppler study next week. -UA Doppler and fetal growth in 2 weeks. -Transfer of prenatal care to [facility] (Dr. Rosana Hoes will be arranging transfer).  Thank you for consult. Please do not hesitate to contact me at the [facility] if you have any questions.  Consultation including face-to-face counseling 45 minutes.

## 2019-01-07 NOTE — Consult Note (Signed)
[facility]  Prenatal Consult       01/07/2019  1:45 PM   I was asked by Dr. Rosana Hoes to consult on this patient for possible preterm delivery.  I had the pleasure of meeting with Jordan Holt today.  She is a 33 y/o [id number] at 8 and 0/[redacted] weeks gestation who was admitted for severe fetal growth restruction with intermittent reversed end-diastolic flow on umbilical Doppler.  Cell free DNA does not show increased risk of aneuploidy.  An estimated fetal weight today was 224 g.  I explained that we do not routinely offer resuscitation to infants less than [redacted] weeks gestation because the chance of survival is so low.  I also explained that we would almost always universally recommend resuscitating a typical infant >[redacted] weeks gestation because the chance of survival with a good outcome is so high.   However, I explained that given the severe growth restriction in this fetus, survival is extremely unlikely should the fetus be delivered at 24 weeks at this size.  I agree with the plan of repeating Dopplers in 1 week and reassessing growth in 2 weeks.  I do not foresee enough fetal growth occurring before that time to have a meaningful impact on survival should the fetus be delivered prematurely.  However, another data point in 2 weeks may give [place] a better understanding of growth and likelihood of survival.  We discussed the various causes of fetal growth restriction, and that there is a chance of fetal demise in the next 2 weeks.  But that if meaningful growth has occurred during that time, the obstetrics team will reconsider hospitalizing Jordan Holt for closer monitoring and, if indicated, delivery for fetal distress.  Thank you for involving [place] in the care of this patient. A member of our team will be available should the family have additional questions.  Time for consultation approximately 30 minutes.   _____________________ Electronically Signed By: Clinton Gallant, MD Neonatologist

## 2019-01-07 NOTE — Progress Notes (Signed)
Faculty Note  In to see patient with Dr. Percell Miller of Neonatology. Patient seen today for severe growth restriction noted on [place] at [place]. EFW 224 gms today at 24 weeks. Dr. Percell Miller reviewed chances of survival in an normally grown infant delivered at 24 weeks versus 25 weeks, etc. Reviewed chances of survival in infant that is severely growth restricted would be extremely unlikely and that intervening at this point (via steroids, monitoring and CS for worsening fetal status) would likely not lead to positive outcome as fetus is simply too small. Reviewed there is a chance that fetus could die in the interim period prior to next ultrasound, however if delivered via CS at this point, given EFW, would most likely not survive in any event. Recommended outpatient, expectant management including repeat doppler in 1 week and growth [place] in 2 weeks with plan for possible admission if infant has grown enough at that point that survival would be possible based on weight. Reviewed likelihood of placental insufficiency and that in many cases, we do not have a good reason for why fetal growth is so restricted. Patient and husband (on phone) verbalize understanding and are in agreement with plan for expectant management now, repeat US in two weeks with possible admission/delivery based on growth and dopplers at that point.  She has had care with an OB/GYN in [facility], will transfer care to [facility], I will facilitate this.   Answered all questions, they verbalize understanding of plan.   To keep appt with MFM 01/14/19.   Feliz Beam, M.D. Attending Center for [employer])

## 2019-01-09 ENCOUNTER — Encounter: Payer: BC Managed Care – PPO | Admitting: Obstetrics and Gynecology

## 2019-01-14 ENCOUNTER — Ambulatory Visit (HOSPITAL_COMMUNITY): Payer: BC Managed Care – PPO | Admitting: *Deleted

## 2019-01-14 ENCOUNTER — Other Ambulatory Visit: Payer: Self-pay

## 2019-01-14 ENCOUNTER — Ambulatory Visit (HOSPITAL_COMMUNITY)
Admission: RE | Admit: 2019-01-14 | Discharge: 2019-01-14 | Disposition: A | Discharge status: Home / Self Care | Payer: BC Managed Care – PPO | Source: Ambulatory Visit | Attending: Obstetrics and Gynecology | Admitting: Obstetrics and Gynecology

## 2019-01-14 ENCOUNTER — Encounter (HOSPITAL_COMMUNITY): Payer: Self-pay | Admitting: *Deleted

## 2019-01-14 VITALS — BP 132/73 | HR 82 | Temp 97.2°F

## 2019-01-14 DIAGNOSIS — O3412 Maternal care for benign tumor of corpus uteri, second trimester: Secondary | ICD-10-CM

## 2019-01-14 DIAGNOSIS — D259 Leiomyoma of uterus, unspecified: Secondary | ICD-10-CM | POA: Diagnosis not present

## 2019-01-14 DIAGNOSIS — Z87898 Personal history of other specified conditions: Secondary | ICD-10-CM

## 2019-01-14 DIAGNOSIS — O365990 Maternal care for other known or suspected poor fetal growth, unspecified trimester, not applicable or unspecified: Secondary | ICD-10-CM | POA: Diagnosis present

## 2019-01-14 DIAGNOSIS — O365920 Maternal care for other known or suspected poor fetal growth, second trimester, not applicable or unspecified: Secondary | ICD-10-CM

## 2019-01-14 DIAGNOSIS — O359XX0 Maternal care for (suspected) fetal abnormality and damage, unspecified, not applicable or unspecified: Secondary | ICD-10-CM

## 2019-01-14 DIAGNOSIS — O09212 Supervision of pregnancy with history of pre-term labor, second trimester: Secondary | ICD-10-CM

## 2019-01-14 DIAGNOSIS — O34219 Maternal care for unspecified type scar from previous cesarean delivery: Secondary | ICD-10-CM

## 2019-01-14 DIAGNOSIS — Z3A25 25 weeks gestation of pregnancy: Secondary | ICD-10-CM

## 2019-01-15 ENCOUNTER — Ambulatory Visit (INDEPENDENT_AMBULATORY_CARE_PROVIDER_SITE_OTHER): Payer: BC Managed Care – PPO | Admitting: Obstetrics and Gynecology

## 2019-01-15 ENCOUNTER — Encounter: Payer: Self-pay | Admitting: Obstetrics and Gynecology

## 2019-01-15 DIAGNOSIS — O09899 Supervision of other high risk pregnancies, unspecified trimester: Secondary | ICD-10-CM

## 2019-01-15 DIAGNOSIS — Z8759 Personal history of other complications of pregnancy, childbirth and the puerperium: Secondary | ICD-10-CM

## 2019-01-15 DIAGNOSIS — O365990 Maternal care for other known or suspected poor fetal growth, unspecified trimester, not applicable or unspecified: Secondary | ICD-10-CM

## 2019-01-15 DIAGNOSIS — O0990 Supervision of high risk pregnancy, unspecified, unspecified trimester: Secondary | ICD-10-CM

## 2019-01-15 DIAGNOSIS — O09292 Supervision of pregnancy with other poor reproductive or obstetric history, second trimester: Secondary | ICD-10-CM

## 2019-01-15 DIAGNOSIS — Z2839 Other underimmunization status: Secondary | ICD-10-CM

## 2019-01-15 DIAGNOSIS — O09892 Supervision of other high risk pregnancies, second trimester: Secondary | ICD-10-CM

## 2019-01-15 DIAGNOSIS — O0992 Supervision of high risk pregnancy, unspecified, second trimester: Secondary | ICD-10-CM

## 2019-01-15 DIAGNOSIS — Z3A25 25 weeks gestation of pregnancy: Secondary | ICD-10-CM

## 2019-01-15 DIAGNOSIS — O09299 Supervision of pregnancy with other poor reproductive or obstetric history, unspecified trimester: Secondary | ICD-10-CM

## 2019-01-15 DIAGNOSIS — O34219 Maternal care for unspecified type scar from previous cesarean delivery: Secondary | ICD-10-CM

## 2019-01-15 NOTE — Progress Notes (Signed)
I connected with  Anzal Umholtz on 01/15/19 at  9:10 AM EST by telephone and verified that I am speaking with the correct person using two identifiers.   I discussed the limitations, risks, security and privacy concerns of performing an evaluation and management service by telephone and the availability of in person appointments. I also discussed with the patient that there may be a patient responsible charge related to this service. The patient expressed understanding and agreed to proceed.  Bethanne Ginger, CMA 01/15/2019  9:08 AM

## 2019-01-15 NOTE — Progress Notes (Signed)
TELEHEALTH OBSTETRICS PRENATAL VIRTUAL VIDEO VISIT ENCOUNTER NOTE  Provider location: Center for [facility] at [place]   I connected with Jordan Holt on 01/15/19 at  9:10 AM EST by MyChart Video Encounter at home and verified that I am speaking with the correct person using two identifiers.   I discussed the limitations, risks, security and privacy concerns of performing an evaluation and management service virtually and the availability of in person appointments. I also discussed with the patient that there may be a patient responsible charge related to this service. The patient expressed understanding and agreed to proceed.  INITIAL PRENATAL VISIT NOTE  Subjective:  Jordan Holt is a 34 y.o. [id number] at [redacted]w[redacted]d by LMP being seen today for her initial prenatal visit. She has an obstetric history significant for 1 CS @ 36 weeks for twins breech in labor. 1 X SVD @ 37 weeks due to IOL for gTHN. She has a medical history significant for n/a. She is transferring care from [facility] as she wants to deliver at [facility].  Patient reports some stretching/pulling in abdomen.  Contractions: Not present. Vag. Bleeding: None.  Movement: Present. Denies leaking of fluid.   Past Medical History:  Diagnosis Date  . Anemia   . PCOS (polycystic ovarian syndrome)     Past Surgical History:  Procedure Laterality Date  . CESAREAN SECTION    . CESAREAN SECTION MULTI-GESTATIONAL  10/08/2015   Procedure: CESAREAN SECTION MULTI-GESTATIONAL;  Surgeon: Osborne Oman, MD;  Location: [place];  Service: Obstetrics;;    OB History  Gravida Para Term Preterm AB Living  3 2 1 1   3   SAB TAB Ectopic Multiple Live Births        1 3    # Outcome Date GA Lbr Len/2nd Weight Sex Delivery Anes PTL Lv  3 Current           2A Preterm 10/08/15 [redacted]w[redacted]d  5 lb 10.5 oz (2.565 kg) M CS-LTranv Spinal  LIV  2B Preterm 10/08/15 [redacted]w[redacted]d  5 lb 4.5 oz (2.395 kg) F CS-LTranv Spinal  LIV     Birth Comments: preterm   1 Term 12/19/12 [redacted]w[redacted]d 00:21 / 00:38 4 lb 11.7 oz (2.145 kg) M Vag-Spont Local  LIV    Social History   Socioeconomic History  . Marital status: Married    Spouse name: Jordan Holt  . Number of children: Not on file  . Years of education: Not on file  . Highest education level: Not on file  Occupational History  . Not on file  Tobacco Use  . Smoking status: Never Smoker  . Smokeless tobacco: Never Used  Substance and Sexual Activity  . Alcohol use: No  . Drug use: No  . Sexual activity: Yes    Birth control/protection: None  Other Topics Concern  . Not on file  Social History Narrative  . Not on file   Social Determinants of Health   Financial Resource Strain:   . Difficulty of Paying Living Expenses: Not on file  Food Insecurity:   . Worried About Charity fundraiser in the Last Year: Not on file  . Ran Out of Food in the Last Year: Not on file  Transportation Needs:   . Lack of Transportation (Medical): Not on file  . Lack of Transportation (Non-Medical): Not on file  Physical Activity:   . Days of Exercise per Week: Not on file  . Minutes of Exercise per Session: Not on file  Stress:   .  Feeling of Stress : Not on file  Social Connections:   . Frequency of Communication with Friends and Family: Not on file  . Frequency of Social Gatherings with Friends and Family: Not on file  . Attends Religious Services: Not on file  . Active Member of Clubs or Organizations: Not on file  . Attends Archivist Meetings: Not on file  . Marital Status: Not on file    Family History  Problem Relation Age of Onset  . Diabetes Mother   . Healthy Father   . Alcohol abuse Neg Hx   . Arthritis Neg Hx   . Asthma Neg Hx   . Birth defects Neg Hx   . Cancer Neg Hx   . COPD Neg Hx   . Depression Neg Hx   . Drug abuse Neg Hx   . Early death Neg Hx   . Hearing loss Neg Hx   . Heart disease Neg Hx   . Hyperlipidemia Neg Hx   . Hypertension Neg Hx   . Kidney disease Neg Hx    . Learning disabilities Neg Hx   . Mental illness Neg Hx   . Mental retardation Neg Hx   . Miscarriages / Stillbirths Neg Hx   . Stroke Neg Hx   . Vision loss Neg Hx     Current Outpatient Medications:  .  Ferrous Sulfate (IRON) 325 (65 Fe) MG TABS, Take 1 tablet (325 mg total) by mouth daily., Disp: 30 tablet, Rfl: 6 .  Prenatal Vit-Fe Fumarate-FA (MULTIVITAMIN-PRENATAL) 27-0.8 MG TABS tablet, Take 1 tablet by mouth daily at 12 noon., Disp: , Rfl:   No Known Allergies  Review of Systems: Negative except for what is mentioned in HPI.  Objective:   There were no vitals filed for this visit.  Fetal Status:     Movement: Present     General:  Alert, oriented and cooperative. Patient is in no acute distress.  Respiratory: Normal respiratory effort, no problems with respiration noted  Mental Status: Normal mood and affect. Normal behavior. Normal judgment and thought content.  Rest of physical exam deferred due to type of encounter   Assessment and Plan:  Pregnancy: G3P1103 at [redacted]w[redacted]d by LMP  1. Supervision of high risk pregnancy, antepartum Pt will buy BP cuff  2. Hx of preeclampsia, prior pregnancy, currently pregnant IOL for GHTN in first pregnancy, received mag sulfate but not noted if PEC or not  3. Pregnancy affected by fetal growth restriction - < 1st%tile at anatomy, intermittent reverse end diastolic flow - Remained < 1st %tile (224 gms) at 24 weeks, counseled by MFM and NICU and myself regarding extremely low probability of survival of infant at this gestational age and weight, recommendation to repeat growth in 2 weeks and reassess for possible admission/BTMZ/steroids, which she was agreeable to - repeat US for growth 01/21/19 and reassess for delivery  4. Maternal varicella, non-immune  5. History of twin pregnancy in prior pregnancy CS with breech in labor @ 36 weeks  6. Previous cesarean delivery affecting pregnancy, antepartum Needs TOLAC counseling  7.  History of gestational hypertension Pt reports she was induced with first pregnancy for gHTN with no PEC however EMR shows she did receive mag sulfate   Preterm labor symptoms and general obstetric precautions including but not limited to vaginal bleeding, contractions, leaking of fluid and fetal movement were reviewed in detail with the patient. I discussed the assessment and treatment plan with the patient. The patient was provided  an opportunity to ask questions and all were answered. The patient agreed with the plan and demonstrated an understanding of the instructions. The patient was advised to call back or seek an in-person office evaluation/go to [facility]U at [facility] for any urgent or concerning symptoms. Please refer to After Visit Summary for other counseling recommendations.   I provided 15 minutes of face-to-face time during this encounter.  F/u 2 weeks for 2 hr GTT, 3rd trim labs  Future Appointments  Date Time Provider [place]  01/21/2019  1:00 PM [place] MFC-US  01/21/2019  1:00 PM [place], [place] for [place], [place]

## 2019-01-21 ENCOUNTER — Ambulatory Visit (HOSPITAL_COMMUNITY): Payer: BC Managed Care – PPO | Admitting: *Deleted

## 2019-01-21 ENCOUNTER — Other Ambulatory Visit: Payer: Self-pay

## 2019-01-21 ENCOUNTER — Encounter (HOSPITAL_COMMUNITY): Payer: Self-pay

## 2019-01-21 ENCOUNTER — Ambulatory Visit (HOSPITAL_COMMUNITY)
Admission: RE | Admit: 2019-01-21 | Discharge: 2019-01-21 | Disposition: A | Discharge status: Home / Self Care | Payer: BC Managed Care – PPO | Source: Ambulatory Visit | Attending: Obstetrics and Gynecology | Admitting: Obstetrics and Gynecology

## 2019-01-21 ENCOUNTER — Ambulatory Visit: Payer: BC Managed Care – PPO

## 2019-01-21 ENCOUNTER — Other Ambulatory Visit (HOSPITAL_COMMUNITY): Payer: Self-pay | Admitting: *Deleted

## 2019-01-21 DIAGNOSIS — O365920 Maternal care for other known or suspected poor fetal growth, second trimester, not applicable or unspecified: Secondary | ICD-10-CM

## 2019-01-21 DIAGNOSIS — O0990 Supervision of high risk pregnancy, unspecified, unspecified trimester: Secondary | ICD-10-CM | POA: Diagnosis present

## 2019-01-21 DIAGNOSIS — O365990 Maternal care for other known or suspected poor fetal growth, unspecified trimester, not applicable or unspecified: Secondary | ICD-10-CM | POA: Diagnosis present

## 2019-01-21 DIAGNOSIS — O09292 Supervision of pregnancy with other poor reproductive or obstetric history, second trimester: Secondary | ICD-10-CM

## 2019-01-21 DIAGNOSIS — O3412 Maternal care for benign tumor of corpus uteri, second trimester: Secondary | ICD-10-CM

## 2019-01-21 DIAGNOSIS — O09212 Supervision of pregnancy with history of pre-term labor, second trimester: Secondary | ICD-10-CM

## 2019-01-21 DIAGNOSIS — Z362 Encounter for other antenatal screening follow-up: Secondary | ICD-10-CM

## 2019-01-21 DIAGNOSIS — O365921 Maternal care for other known or suspected poor fetal growth, second trimester, fetus 1: Secondary | ICD-10-CM

## 2019-01-21 DIAGNOSIS — O34219 Maternal care for unspecified type scar from previous cesarean delivery: Secondary | ICD-10-CM

## 2019-01-21 DIAGNOSIS — O359XX0 Maternal care for (suspected) fetal abnormality and damage, unspecified, not applicable or unspecified: Secondary | ICD-10-CM | POA: Diagnosis not present

## 2019-01-21 DIAGNOSIS — Z3A26 26 weeks gestation of pregnancy: Secondary | ICD-10-CM | POA: Diagnosis not present

## 2019-01-21 DIAGNOSIS — D259 Leiomyoma of uterus, unspecified: Secondary | ICD-10-CM

## 2019-01-21 NOTE — Consult Note (Signed)
MFM Note  This patient was seen for a follow-up exam due to fetal growth restriction.  Absent end-diastolic flow was noted on the umbilical artery Doppler studies last week.  The patient reports feeling fetal movements throughout the day.  She denies any problems since her last exam.  Her blood pressure today was 125/78.  Severe fetal growth restriction with an overall EFW measuring less than the 1st percentile for her gestational age continues to be noted.  The fetus only grew about 2 ounces over the past 2 weeks.    There was normal amniotic fluid noted today.  Fetal movements were noted throughout today's ultrasound exam.  A small placenta that was globular in shape with multiple lakes/lacunae was noted.  Echogenic bowel was noted in the fetal abdomen.  The patient was advised that this is a common finding in severely growth restricted fetuses.  Doppler studies of the umbilical arteries performed due to fetal growth restriction continues to show absent end-diastolic flow.  There were no signs of reversed end-diastolic flow.  The increased risk of a fetal demise due to the lack of fetal growth and the abnormal umbilical artery Doppler studies was discussed with the patient and her husband via telephone.  They were advised that based on the EFW obtained today (282 g), the fetus is not considered viable.  A fetus is usually considered viable when the EFW is between 400 to 500 g.  Due to the increased risk of a fetal demise, another ultrasound for assessment of the umbilical artery Doppler studies was scheduled at the end of this week.  Should reversed end-diastolic flow be noted at that time, hospitalization and the administration of a complete course of antenatal corticosteroids will be considered at that time.  The patient and her husband were encouraged to think about whether they would want to have a classical cesarean delivery which may have ramifications for her future pregnancies,  for delivery  of a fetus that may or may not survive after delivery due to the significantly small size.  They understand that delivery, when the time comes, will be the only way to prevent a fetal demise.  We will readdress this issue when she returns for her next ultrasound later this week.  I encouraged her husband to be present at her next ultrasound exam.  The increased risk of developing preeclampsia due to abnormal placental function was discussed today.  The signs and symptoms of preeclampsia were reviewed.  She was advised to come to the hospital should she develop any signs or symptoms of severe preeclampsia.  Another ultrasound was scheduled in 4 days.  A total of 15 minutes was spent counseling and coordinating the care for this patient.  Greater than 50% of the time was spent in direct face-to-face contact.

## 2019-01-23 ENCOUNTER — Other Ambulatory Visit: Payer: Self-pay | Admitting: Lactation Services

## 2019-01-23 DIAGNOSIS — O0990 Supervision of high risk pregnancy, unspecified, unspecified trimester: Secondary | ICD-10-CM

## 2019-01-25 ENCOUNTER — Encounter (HOSPITAL_COMMUNITY): Payer: Self-pay

## 2019-01-25 ENCOUNTER — Other Ambulatory Visit: Payer: Self-pay

## 2019-01-25 ENCOUNTER — Ambulatory Visit (HOSPITAL_COMMUNITY)
Admission: RE | Admit: 2019-01-25 | Discharge: 2019-01-25 | Disposition: A | Discharge status: Home / Self Care | Payer: BC Managed Care – PPO | Source: Ambulatory Visit | Attending: Obstetrics and Gynecology | Admitting: Obstetrics and Gynecology

## 2019-01-25 ENCOUNTER — Ambulatory Visit (HOSPITAL_COMMUNITY): Payer: BC Managed Care – PPO | Admitting: *Deleted

## 2019-01-25 DIAGNOSIS — O0990 Supervision of high risk pregnancy, unspecified, unspecified trimester: Secondary | ICD-10-CM | POA: Insufficient documentation

## 2019-01-25 DIAGNOSIS — O365921 Maternal care for other known or suspected poor fetal growth, second trimester, fetus 1: Secondary | ICD-10-CM | POA: Diagnosis not present

## 2019-01-25 DIAGNOSIS — O365920 Maternal care for other known or suspected poor fetal growth, second trimester, not applicable or unspecified: Secondary | ICD-10-CM | POA: Diagnosis present

## 2019-01-25 DIAGNOSIS — Z3A26 26 weeks gestation of pregnancy: Secondary | ICD-10-CM | POA: Diagnosis not present

## 2019-01-25 NOTE — Consult Note (Signed)
MFM Note  This patient was seen due to severe fetal growth restriction.  She denies any problems since her last exam and reports feeling fetal movements throughout the day.  The EFW obtained 4 days ago was only 282 g.  Doppler studies of the umbilical arteries performed today shows intermittent reversed end-diastolic flow.  There was normal amniotic fluid noted today.  Fetal movements were noted throughout today's exam.  The placenta appears globular in appearance with multiple small lakes.  Echogenic patches were noted throughout the placenta indicating probable areas of infarction.  The patient was advised that placental dysfunction is the most likely cause of the severe fetal growth restriction.  The patient reports one prior vaginal delivery with her first pregnancy.  She subsequently had a cesarean section for the delivery of twins (due to malpresentation) in her second pregnancy.  The significantly increased risk of a stillbirth due to the reversed end-diastolic flow noted on her umbilical artery Doppler studies was discussed with the patient.  She and her husband who was on the phone, were advised that management options include hospitalization with the administration of a complete course of steroids or continued expectant management with close follow-up.  They were advised again that delivery would be the only way to avoid an IUFD.  However based on the estimated weight obtained 4 days ago, it is questionable if the baby would survive after delivery due to the extremely small size.    They were advised that based on the severe fetal growth restriction and her history of a prior cesarean delivery, that an induction of labor would probably not be recommended unless a stillbirth has occurred.  If a cesarean delivery had to be performed due to nonreassuring fetal status, there is a high likelihood that she would require a classical cesarean delivery.  She understands that if she has undergoes a  classical C-section, that she will require C-sections for all of her future deliveries and there is an increased risk of uterine rupture.  After all management options were reviewed with the patient and her husband, they decided to pursue expectant management for now, with the understanding that an IUFD may occur before she returns for her next ultrasound exam.  She was advised to continue to monitor fetal movements and go to the hospital for decreased fetal movements.  Due to the increased risk of preeclampsia in women who have severe placental dysfunction, preeclampsia precautions were reviewed again.    We will see the patient again early next week for another ultrasound.  A total of 15 minutes was spent counseling and coordinating the care for this patient.  Greater than 50% of the time was spent in direct face-to-face contact.

## 2019-01-31 ENCOUNTER — Other Ambulatory Visit: Payer: Self-pay

## 2019-01-31 ENCOUNTER — Telehealth (HOSPITAL_COMMUNITY): Payer: Self-pay | Admitting: *Deleted

## 2019-01-31 ENCOUNTER — Ambulatory Visit (HOSPITAL_COMMUNITY): Payer: BC Managed Care – PPO | Admitting: *Deleted

## 2019-01-31 ENCOUNTER — Other Ambulatory Visit: Payer: BC Managed Care – PPO

## 2019-01-31 ENCOUNTER — Encounter (HOSPITAL_COMMUNITY): Payer: Self-pay

## 2019-01-31 ENCOUNTER — Other Ambulatory Visit: Payer: Self-pay | Admitting: Maternal & Fetal Medicine

## 2019-01-31 ENCOUNTER — Other Ambulatory Visit (HOSPITAL_COMMUNITY): Payer: Self-pay | Admitting: Obstetrics and Gynecology

## 2019-01-31 ENCOUNTER — Ambulatory Visit (INDEPENDENT_AMBULATORY_CARE_PROVIDER_SITE_OTHER): Payer: BC Managed Care – PPO | Admitting: Family Medicine

## 2019-01-31 ENCOUNTER — Other Ambulatory Visit (HOSPITAL_COMMUNITY): Payer: Self-pay | Admitting: *Deleted

## 2019-01-31 ENCOUNTER — Ambulatory Visit (HOSPITAL_COMMUNITY)
Admission: RE | Admit: 2019-01-31 | Discharge: 2019-01-31 | Disposition: A | Discharge status: Home / Self Care | Payer: BC Managed Care – PPO | Source: Ambulatory Visit | Attending: Obstetrics and Gynecology | Admitting: Obstetrics and Gynecology

## 2019-01-31 VITALS — BP 124/80 | HR 84 | Wt 175.0 lb

## 2019-01-31 DIAGNOSIS — Z3A27 27 weeks gestation of pregnancy: Secondary | ICD-10-CM

## 2019-01-31 DIAGNOSIS — O0992 Supervision of high risk pregnancy, unspecified, second trimester: Secondary | ICD-10-CM

## 2019-01-31 DIAGNOSIS — O09292 Supervision of pregnancy with other poor reproductive or obstetric history, second trimester: Secondary | ICD-10-CM

## 2019-01-31 DIAGNOSIS — O0990 Supervision of high risk pregnancy, unspecified, unspecified trimester: Secondary | ICD-10-CM

## 2019-01-31 DIAGNOSIS — O09299 Supervision of pregnancy with other poor reproductive or obstetric history, unspecified trimester: Secondary | ICD-10-CM

## 2019-01-31 DIAGNOSIS — O365920 Maternal care for other known or suspected poor fetal growth, second trimester, not applicable or unspecified: Secondary | ICD-10-CM

## 2019-01-31 DIAGNOSIS — O3412 Maternal care for benign tumor of corpus uteri, second trimester: Secondary | ICD-10-CM

## 2019-01-31 DIAGNOSIS — D259 Leiomyoma of uterus, unspecified: Secondary | ICD-10-CM

## 2019-01-31 DIAGNOSIS — O365990 Maternal care for other known or suspected poor fetal growth, unspecified trimester, not applicable or unspecified: Secondary | ICD-10-CM | POA: Diagnosis present

## 2019-01-31 DIAGNOSIS — O34219 Maternal care for unspecified type scar from previous cesarean delivery: Secondary | ICD-10-CM

## 2019-01-31 DIAGNOSIS — O359XX0 Maternal care for (suspected) fetal abnormality and damage, unspecified, not applicable or unspecified: Secondary | ICD-10-CM

## 2019-01-31 DIAGNOSIS — O09212 Supervision of pregnancy with history of pre-term labor, second trimester: Secondary | ICD-10-CM

## 2019-01-31 NOTE — Patient Instructions (Signed)

## 2019-01-31 NOTE — Progress Notes (Signed)
   PRENATAL VISIT NOTE  Subjective:  Jordan Holt is a 34 y.o. G3P1103 at [redacted]w[redacted]d being seen today for ongoing prenatal care.  She is currently monitored for the following issues for this high-risk pregnancy and has Previous cesarean delivery affecting pregnancy, antepartum; Anemia affecting pregnancy in first trimester; History of twin pregnancy in prior pregnancy; Maternal varicella, non-immune; Ptyalism; Hx of preeclampsia, prior pregnancy, currently pregnant; Pregnancy affected by fetal growth restriction; Supervision of high risk pregnancy, antepartum; and History of gestational hypertension on their problem list.  Patient reports no complaints.  Contractions: Not present. Vag. Bleeding: None.  Movement: Present. Denies leaking of fluid.   The following portions of the patient's history were reviewed and updated as appropriate: allergies, current medications, past family history, past medical history, past social history, past surgical history and problem list.   Objective:   Vitals:   01/31/19 0936  BP: 124/80  Pulse: 84  Weight: 175 lb (79.4 kg)    Fetal Status: Fetal Heart Rate (bpm): 156   Movement: Present     General:  Alert, oriented and cooperative. Patient is in no acute distress.  Skin: Skin is warm and dry. No rash noted.   Cardiovascular: Normal heart rate noted  Respiratory: Normal respiratory effort, no problems with respiration noted  Abdomen: Soft, gravid, appropriate for gestational age.  Pain/Pressure: Present     Pelvic: Cervical exam deferred        Extremities: Normal range of motion.  Edema: None  Mental Status: Normal mood and affect. Normal behavior. Normal judgment and thought content.   Assessment and Plan:  Pregnancy: G3P1103 at [redacted]w[redacted]d 1. Hx of preeclampsia, prior pregnancy, currently pregnant BP is ok today  2. Pregnancy affected by fetal growth restriction Normal U/S today for movement and fluid, persistent AEDF with no REDF today--for f/u growth  and NICU consult on 02/04/19  3. Supervision of high risk pregnancy, antepartum 28 wk labs today  Preterm labor symptoms and general obstetric precautions including but not limited to vaginal bleeding, contractions, leaking of fluid and fetal movement were reviewed in detail with the patient. Please refer to After Visit Summary for other counseling recommendations.   Return in 2 weeks (on 02/14/2019) for in person time visit with MFM u/s, HRC.  Future Appointments  Date Time Provider [place]  02/04/2019  9:00 AM [place] MFC-US  02/04/2019  9:15 AM [place] 4 WH-MFCUS MFC-US  02/18/2019  9:15 AM Donnamae Jude, MD WOC-WOCA WOC    Donnamae Jude, MD

## 2019-02-01 LAB — CBC
Hematocrit: 35.4 % (ref 34.0–46.6)
Hemoglobin: 11.4 g/dL (ref 11.1–15.9)
MCH: 25.2 pg — ABNORMAL LOW (ref 26.6–33.0)
MCHC: 32.2 g/dL (ref 31.5–35.7)
MCV: 78 fL — ABNORMAL LOW (ref 79–97)
Platelets: 373 10*3/uL (ref 150–450)
RBC: 4.53 x10E6/uL (ref 3.77–5.28)
RDW: 15.9 % — ABNORMAL HIGH (ref 11.7–15.4)
WBC: 8 10*3/uL (ref 3.4–10.8)

## 2019-02-01 LAB — RPR: RPR Ser Ql: NONREACTIVE

## 2019-02-01 LAB — HIV ANTIBODY (ROUTINE TESTING W REFLEX): HIV Screen 4th Generation wRfx: NONREACTIVE

## 2019-02-01 LAB — GLUCOSE TOLERANCE, 2 HOURS W/ 1HR
Glucose, 1 hour: 133 mg/dL (ref 65–179)
Glucose, 2 hour: 127 mg/dL (ref 65–152)
Glucose, Fasting: 79 mg/dL (ref 65–91)

## 2019-02-04 ENCOUNTER — Ambulatory Visit (HOSPITAL_COMMUNITY)
Admission: RE | Admit: 2019-02-04 | Discharge: 2019-02-04 | Disposition: A | Discharge status: Home / Self Care | Payer: BC Managed Care – PPO | Source: Ambulatory Visit | Attending: Obstetrics and Gynecology | Admitting: Obstetrics and Gynecology

## 2019-02-04 ENCOUNTER — Encounter (HOSPITAL_COMMUNITY): Payer: Self-pay

## 2019-02-04 ENCOUNTER — Ambulatory Visit (HOSPITAL_COMMUNITY): Payer: BC Managed Care – PPO | Admitting: *Deleted

## 2019-02-04 ENCOUNTER — Other Ambulatory Visit (HOSPITAL_COMMUNITY): Payer: Self-pay | Admitting: *Deleted

## 2019-02-04 ENCOUNTER — Other Ambulatory Visit: Payer: Self-pay

## 2019-02-04 DIAGNOSIS — O359XX0 Maternal care for (suspected) fetal abnormality and damage, unspecified, not applicable or unspecified: Secondary | ICD-10-CM | POA: Diagnosis not present

## 2019-02-04 DIAGNOSIS — O365990 Maternal care for other known or suspected poor fetal growth, unspecified trimester, not applicable or unspecified: Secondary | ICD-10-CM | POA: Diagnosis not present

## 2019-02-04 DIAGNOSIS — D259 Leiomyoma of uterus, unspecified: Secondary | ICD-10-CM | POA: Diagnosis not present

## 2019-02-04 DIAGNOSIS — Z362 Encounter for other antenatal screening follow-up: Secondary | ICD-10-CM

## 2019-02-04 DIAGNOSIS — O0990 Supervision of high risk pregnancy, unspecified, unspecified trimester: Secondary | ICD-10-CM

## 2019-02-04 DIAGNOSIS — O365930 Maternal care for other known or suspected poor fetal growth, third trimester, not applicable or unspecified: Secondary | ICD-10-CM

## 2019-02-04 DIAGNOSIS — O3412 Maternal care for benign tumor of corpus uteri, second trimester: Secondary | ICD-10-CM | POA: Diagnosis not present

## 2019-02-04 DIAGNOSIS — Z3A28 28 weeks gestation of pregnancy: Secondary | ICD-10-CM

## 2019-02-04 DIAGNOSIS — O365920 Maternal care for other known or suspected poor fetal growth, second trimester, not applicable or unspecified: Secondary | ICD-10-CM

## 2019-02-04 NOTE — Consult Note (Signed)
Neonatology Consult Note:  At the request of the patients obstetrician Dr. Kennon Rounds I met with Ms. Cosio (her husband joined via phone)  who is 28 weeks currently with pregnancy complicated by severe fetal growth restriction.  The EFW obtained today was 399 g.   Poor interval fetal weight gain is seen (117 g over 2 weeks).  Doppler studies of the umbilical arteries performed today showed  intermittent absent end- diastolic flow with normal amniotic fluid and good fetal activity seen.  Per MFM delivery is not recommended now as umbilical artery Doppler studies have not worsened.  Current recommendations include UA Doppler studies Mon-Thu, Fetal growth in 2 weeks and antenatal corticosteroids to be discussed at next visit.  We discussed the severe fetal growth restriction and limitations of our resuscitation equipment in infants with a BW < 450-500 g.  We discussed morbidity/mortality at this gestional age, delivery room resuscitation, including intubation and surfactant in DR.  Discussed mechanical ventilation and risk for chronic lung disease, risk for IVH with potential for motor / cognitive deficits. Discussed NG / OG feeds, benefits of MBM in reducing incidence of NEC as well as challenges of initiating enteral feedings in SGA infants.    Discussed likely length of stay. Thank you for allowing [place] to participate in her care.  Please call with questions.  Higinio Roger, DO  Neonatologist  The total length of face-to-face or floor / unit time for this encounter was 30 minutes.  Counseling and / or coordination of care was greater than fifty percent of the time.

## 2019-02-07 ENCOUNTER — Ambulatory Visit (HOSPITAL_COMMUNITY): Payer: BC Managed Care – PPO

## 2019-02-07 ENCOUNTER — Encounter (HOSPITAL_COMMUNITY): Payer: Self-pay

## 2019-02-07 ENCOUNTER — Other Ambulatory Visit: Payer: Self-pay

## 2019-02-07 ENCOUNTER — Ambulatory Visit (HOSPITAL_COMMUNITY)
Admission: RE | Admit: 2019-02-07 | Discharge: 2019-02-07 | Disposition: A | Discharge status: Home / Self Care | Payer: BC Managed Care – PPO | Source: Ambulatory Visit | Attending: Obstetrics and Gynecology | Admitting: Obstetrics and Gynecology

## 2019-02-07 ENCOUNTER — Ambulatory Visit (HOSPITAL_COMMUNITY): Payer: BC Managed Care – PPO | Admitting: *Deleted

## 2019-02-07 DIAGNOSIS — Z3A28 28 weeks gestation of pregnancy: Secondary | ICD-10-CM

## 2019-02-07 DIAGNOSIS — O0990 Supervision of high risk pregnancy, unspecified, unspecified trimester: Secondary | ICD-10-CM | POA: Insufficient documentation

## 2019-02-07 DIAGNOSIS — O359XX0 Maternal care for (suspected) fetal abnormality and damage, unspecified, not applicable or unspecified: Secondary | ICD-10-CM | POA: Diagnosis not present

## 2019-02-07 DIAGNOSIS — O365930 Maternal care for other known or suspected poor fetal growth, third trimester, not applicable or unspecified: Secondary | ICD-10-CM | POA: Diagnosis not present

## 2019-02-07 DIAGNOSIS — O365931 Maternal care for other known or suspected poor fetal growth, third trimester, fetus 1: Secondary | ICD-10-CM

## 2019-02-07 DIAGNOSIS — O34219 Maternal care for unspecified type scar from previous cesarean delivery: Secondary | ICD-10-CM | POA: Diagnosis not present

## 2019-02-07 DIAGNOSIS — O09213 Supervision of pregnancy with history of pre-term labor, third trimester: Secondary | ICD-10-CM | POA: Diagnosis not present

## 2019-02-07 MED ORDER — BETAMETHASONE SOD PHOS & ACET 6 (3-3) MG/ML IJ SUSP
12.0000 mg | Freq: Once | INTRAMUSCULAR | Status: AC
  Administered 2019-02-07 – 2019-02-08 (×2): 12 mg via INTRAMUSCULAR

## 2019-02-08 ENCOUNTER — Ambulatory Visit (HOSPITAL_COMMUNITY): Payer: BC Managed Care – PPO | Attending: Obstetrics and Gynecology

## 2019-02-08 DIAGNOSIS — O365930 Maternal care for other known or suspected poor fetal growth, third trimester, not applicable or unspecified: Secondary | ICD-10-CM | POA: Diagnosis not present

## 2019-02-08 DIAGNOSIS — Z3A00 Weeks of gestation of pregnancy not specified: Secondary | ICD-10-CM | POA: Diagnosis not present

## 2019-02-08 MED ORDER — BETAMETHASONE SOD PHOS & ACET 6 (3-3) MG/ML IJ SUSP: 12.0000 mg | Freq: Once | INTRAMUSCULAR | Status: DC

## 2019-02-11 ENCOUNTER — Encounter (HOSPITAL_COMMUNITY): Payer: Self-pay

## 2019-02-11 ENCOUNTER — Other Ambulatory Visit: Payer: Self-pay

## 2019-02-11 ENCOUNTER — Ambulatory Visit (HOSPITAL_COMMUNITY): Payer: BC Managed Care – PPO | Admitting: *Deleted

## 2019-02-11 ENCOUNTER — Ambulatory Visit (HOSPITAL_COMMUNITY)
Admission: RE | Admit: 2019-02-11 | Discharge: 2019-02-11 | Disposition: A | Discharge status: Home / Self Care | Payer: BC Managed Care – PPO | Source: Ambulatory Visit | Attending: Obstetrics and Gynecology | Admitting: Obstetrics and Gynecology

## 2019-02-11 VITALS — BP 125/84 | HR 70 | Temp 97.7°F

## 2019-02-11 DIAGNOSIS — O359XX0 Maternal care for (suspected) fetal abnormality and damage, unspecified, not applicable or unspecified: Secondary | ICD-10-CM | POA: Diagnosis not present

## 2019-02-11 DIAGNOSIS — O0990 Supervision of high risk pregnancy, unspecified, unspecified trimester: Secondary | ICD-10-CM | POA: Diagnosis present

## 2019-02-11 DIAGNOSIS — Z3A29 29 weeks gestation of pregnancy: Secondary | ICD-10-CM

## 2019-02-11 DIAGNOSIS — O365931 Maternal care for other known or suspected poor fetal growth, third trimester, fetus 1: Secondary | ICD-10-CM | POA: Diagnosis not present

## 2019-02-11 DIAGNOSIS — O365930 Maternal care for other known or suspected poor fetal growth, third trimester, not applicable or unspecified: Secondary | ICD-10-CM

## 2019-02-14 ENCOUNTER — Other Ambulatory Visit (HOSPITAL_COMMUNITY): Payer: Self-pay | Admitting: *Deleted

## 2019-02-14 ENCOUNTER — Other Ambulatory Visit: Payer: Self-pay

## 2019-02-14 ENCOUNTER — Ambulatory Visit (HOSPITAL_COMMUNITY)
Admission: RE | Admit: 2019-02-14 | Discharge: 2019-02-14 | Disposition: A | Discharge status: Home / Self Care | Payer: BC Managed Care – PPO | Source: Ambulatory Visit | Attending: Obstetrics and Gynecology | Admitting: Obstetrics and Gynecology

## 2019-02-14 ENCOUNTER — Ambulatory Visit (HOSPITAL_COMMUNITY): Payer: BC Managed Care – PPO | Admitting: *Deleted

## 2019-02-14 ENCOUNTER — Encounter (HOSPITAL_COMMUNITY): Payer: Self-pay

## 2019-02-14 DIAGNOSIS — O34219 Maternal care for unspecified type scar from previous cesarean delivery: Secondary | ICD-10-CM | POA: Diagnosis not present

## 2019-02-14 DIAGNOSIS — O359XX0 Maternal care for (suspected) fetal abnormality and damage, unspecified, not applicable or unspecified: Secondary | ICD-10-CM | POA: Diagnosis not present

## 2019-02-14 DIAGNOSIS — O0990 Supervision of high risk pregnancy, unspecified, unspecified trimester: Secondary | ICD-10-CM

## 2019-02-14 DIAGNOSIS — O365930 Maternal care for other known or suspected poor fetal growth, third trimester, not applicable or unspecified: Secondary | ICD-10-CM | POA: Diagnosis present

## 2019-02-14 DIAGNOSIS — O09213 Supervision of pregnancy with history of pre-term labor, third trimester: Secondary | ICD-10-CM

## 2019-02-14 DIAGNOSIS — Z3A29 29 weeks gestation of pregnancy: Secondary | ICD-10-CM

## 2019-02-18 ENCOUNTER — Other Ambulatory Visit: Payer: Self-pay

## 2019-02-18 ENCOUNTER — Encounter (HOSPITAL_COMMUNITY): Payer: Self-pay

## 2019-02-18 ENCOUNTER — Ambulatory Visit (HOSPITAL_COMMUNITY): Payer: BC Managed Care – PPO

## 2019-02-18 ENCOUNTER — Ambulatory Visit (INDEPENDENT_AMBULATORY_CARE_PROVIDER_SITE_OTHER): Payer: BC Managed Care – PPO | Admitting: Family Medicine

## 2019-02-18 ENCOUNTER — Other Ambulatory Visit (HOSPITAL_COMMUNITY): Payer: Self-pay | Admitting: *Deleted

## 2019-02-18 ENCOUNTER — Ambulatory Visit (HOSPITAL_COMMUNITY): Payer: BC Managed Care – PPO | Admitting: *Deleted

## 2019-02-18 ENCOUNTER — Ambulatory Visit (HOSPITAL_COMMUNITY)
Admission: RE | Admit: 2019-02-18 | Discharge: 2019-02-18 | Disposition: A | Discharge status: Home / Self Care | Payer: BC Managed Care – PPO | Source: Ambulatory Visit | Attending: Obstetrics and Gynecology | Admitting: Obstetrics and Gynecology

## 2019-02-18 ENCOUNTER — Other Ambulatory Visit (HOSPITAL_COMMUNITY): Payer: BC Managed Care – PPO

## 2019-02-18 VITALS — BP 127/86 | HR 87 | Wt 174.8 lb

## 2019-02-18 VITALS — Temp 97.4°F

## 2019-02-18 DIAGNOSIS — O365990 Maternal care for other known or suspected poor fetal growth, unspecified trimester, not applicable or unspecified: Secondary | ICD-10-CM

## 2019-02-18 DIAGNOSIS — Z23 Encounter for immunization: Secondary | ICD-10-CM

## 2019-02-18 DIAGNOSIS — Z362 Encounter for other antenatal screening follow-up: Secondary | ICD-10-CM | POA: Diagnosis not present

## 2019-02-18 DIAGNOSIS — O359XX0 Maternal care for (suspected) fetal abnormality and damage, unspecified, not applicable or unspecified: Secondary | ICD-10-CM | POA: Diagnosis not present

## 2019-02-18 DIAGNOSIS — O365930 Maternal care for other known or suspected poor fetal growth, third trimester, not applicable or unspecified: Secondary | ICD-10-CM

## 2019-02-18 DIAGNOSIS — O34219 Maternal care for unspecified type scar from previous cesarean delivery: Secondary | ICD-10-CM | POA: Diagnosis not present

## 2019-02-18 DIAGNOSIS — O0990 Supervision of high risk pregnancy, unspecified, unspecified trimester: Secondary | ICD-10-CM

## 2019-02-18 DIAGNOSIS — O0993 Supervision of high risk pregnancy, unspecified, third trimester: Secondary | ICD-10-CM

## 2019-02-18 DIAGNOSIS — Z3A30 30 weeks gestation of pregnancy: Secondary | ICD-10-CM

## 2019-02-18 DIAGNOSIS — O09213 Supervision of pregnancy with history of pre-term labor, third trimester: Secondary | ICD-10-CM

## 2019-02-18 DIAGNOSIS — O09293 Supervision of pregnancy with other poor reproductive or obstetric history, third trimester: Secondary | ICD-10-CM

## 2019-02-18 NOTE — Procedures (Signed)
Jordan Holt 12/31/1985 [redacted]w[redacted]d  Fetus A Non-Stress Test Interpretation for 02/18/19  Indication: IUGR  Fetal Heart Rate A Mode: External Baseline Rate (A): 145 bpm Variability: Moderate Accelerations: 10 x 10 Decelerations: None Multiple birth?: No  Uterine Activity Mode: Palpation, Toco Contraction Frequency (min): None Resting Tone Palpated: Relaxed Resting Time: Adequate  Interpretation (Fetal Testing) Nonstress Test Interpretation: Reactive Overall Impression: Reassuring for gestational age Comments: EFM tracing reviewed by Dr. Donalee Citrin

## 2019-02-18 NOTE — Progress Notes (Signed)
   PRENATAL VISIT NOTE  Subjective:  Jordan Holt is a 34 y.o. G3P1103 at [redacted]w[redacted]d being seen today for ongoing prenatal care.  She is currently monitored for the following issues for this high-risk pregnancy and has Previous cesarean delivery affecting pregnancy, antepartum; Anemia affecting pregnancy in first trimester; History of twin pregnancy in prior pregnancy; Maternal varicella, non-immune; Ptyalism; Hx of preeclampsia, prior pregnancy, currently pregnant; Pregnancy affected by fetal growth restriction; Supervision of high risk pregnancy, antepartum; and History of gestational hypertension on their problem list.  Patient reports no complaints.  Contractions: Not present. Vag. Bleeding: None.  Movement: Present. Denies leaking of fluid.   The following portions of the patient's history were reviewed and updated as appropriate: allergies, current medications, past family history, past medical history, past social history, past surgical history and problem list.   Objective:   Vitals:   02/18/19 0953  BP: 127/86  Pulse: 87  Weight: 174 lb 12.8 oz (79.3 kg)    Fetal Status: Fetal Heart Rate (bpm): 143 Fundal Height: 23 cm Movement: Present     General:  Alert, oriented and cooperative. Patient is in no acute distress.  Skin: Skin is warm and dry. No rash noted.   Cardiovascular: Normal heart rate noted  Respiratory: Normal respiratory effort, no problems with respiration noted  Abdomen: Soft, gravid, appropriate for gestational age.  Pain/Pressure: Absent     Pelvic: Cervical exam deferred        Extremities: Normal range of motion.  Edema: None  Mental Status: Normal mood and affect. Normal behavior. Normal judgment and thought content.   Assessment and Plan:  Pregnancy: G3P1103 at [redacted]w[redacted]d 1. Previous cesarean delivery affecting pregnancy, antepartum For asks about vaginal delivery--given size and breech, advised c-section for now unless, NST and CST were negative, hopeful to not  have to have Classical. Advised that is a decision for time of surgery.  2. Supervision of high risk pregnancy, antepartum Normal 28 wk labs TDaP today  3. Pregnancy affected by fetal growth restriction Has growth today  Preterm labor symptoms and general obstetric precautions including but not limited to vaginal bleeding, contractions, leaking of fluid and fetal movement were reviewed in detail with the patient. Please refer to After Visit Summary for other counseling recommendations.   Return in 1 week (on 02/25/2019) for in person.  Future Appointments  Date Time Provider [place]  02/21/2019  4:00 PM [place] 3 WH-MFCUS MFC-US  02/25/2019  9:15 AM Donnamae Jude, MD WOC-WOCA WOC  02/25/2019 10:45 AM WH-MFC NST WH-MFC MFC-US  02/25/2019 11:30 AM WH-MFC [place] 5 WH-MFCUS MFC-US  02/28/2019  3:45 PM WH-MFC [place] 5 WH-MFCUS MFC-US    Donnamae Jude, MD

## 2019-02-18 NOTE — Patient Instructions (Signed)

## 2019-02-19 ENCOUNTER — Encounter (HOSPITAL_COMMUNITY): Payer: Self-pay | Admitting: Obstetrics and Gynecology

## 2019-02-21 ENCOUNTER — Ambulatory Visit (HOSPITAL_BASED_OUTPATIENT_CLINIC_OR_DEPARTMENT_OTHER)
Admission: RE | Admit: 2019-02-21 | Discharge: 2019-02-21 | Disposition: A | Discharge status: Home / Self Care | Payer: BC Managed Care – PPO | Source: Ambulatory Visit | Attending: Obstetrics and Gynecology | Admitting: Obstetrics and Gynecology

## 2019-02-21 ENCOUNTER — Other Ambulatory Visit: Payer: Self-pay

## 2019-02-21 ENCOUNTER — Ambulatory Visit (HOSPITAL_COMMUNITY): Payer: BC Managed Care – PPO | Admitting: *Deleted

## 2019-02-21 ENCOUNTER — Telehealth: Payer: Self-pay | Admitting: Obstetrics and Gynecology

## 2019-02-21 ENCOUNTER — Encounter (HOSPITAL_COMMUNITY): Payer: Self-pay

## 2019-02-21 DIAGNOSIS — Z3A30 30 weeks gestation of pregnancy: Secondary | ICD-10-CM

## 2019-02-21 DIAGNOSIS — O365990 Maternal care for other known or suspected poor fetal growth, unspecified trimester, not applicable or unspecified: Secondary | ICD-10-CM

## 2019-02-21 DIAGNOSIS — O0990 Supervision of high risk pregnancy, unspecified, unspecified trimester: Secondary | ICD-10-CM

## 2019-02-21 DIAGNOSIS — O365931 Maternal care for other known or suspected poor fetal growth, third trimester, fetus 1: Secondary | ICD-10-CM | POA: Diagnosis not present

## 2019-02-21 NOTE — Consult Note (Addendum)
MFM Note  This patient was seen for umbilical artery Doppler studies due to a severely growth restricted fetus.  The EFW performed 3 days ago was only 448 g at around 30 weeks.  The patient reports feeling fetal movements throughout the day.  She denies any problems since her last exam.  The patient already received the first course of antenatal corticosteroids at the end of January. She had a normal fetal echocardiogram performed by pediatric cardiology 2 days ago.  On today's exam, there was normal amniotic fluid noted.  Umbilical artery Doppler studies performed today showed intermittent reversed end-diastolic flow.  Multiple echogenic areas most likely representing areas of infarction were noted in the posterior placenta.    Due to the intermittent reversed end-diastolic flow noted on today's exam, the patient was sent to the hospital for admission following today's ultrasound exam.    The patient should receive a rescue course of antenatal corticosteroids.  She should also receive magnesium sulfate for fetal neuro protection.  I would have a low threshold for delivery once she completes her rescue course of steroids or at any time for nonreassuring fetal status.    The patient will require a repeat cesarean delivery.  She understands that she may require a classical uterine incision due to the extremely small size of the fetus.  She has had 2 NICU consults already.  A total of 15 minutes was spent counseling and coordinating the care for this patient.  Greater than 50% of the time was spent in direct face-to-face contact.

## 2019-02-21 NOTE — Telephone Encounter (Signed)
OB Telephone  I was called by Dr. Annamaria Boots re: new REDF on Mrs. Ravi's u/s today (normal AFI). Given known severe FGR and new, worsening dopplers, plan is for rescue steroid course, Mg for fetal neuroprotection and likely delivery after 2nd steroid injection if fetal monitoring is reassuring. I told them NICU is full and that I recommend patient be managed out another facility. I called Mrs. Hogeland, who was still at home, and she was amenable to this. She last ate before noon and I told her to stay NPO until she was assessed at a hopsital. I called [facility] and Dr. Cecilie Lowers was amenable to patient being admitted there. I called Mrs. Muller and directed her there.  Durene Romans MD Attending Center for [employer] (Faculty Practice) 02/21/2019 Time: [phone]

## 2019-02-22 ENCOUNTER — Other Ambulatory Visit: Payer: Self-pay | Admitting: Obstetrics & Gynecology

## 2019-02-22 ENCOUNTER — Inpatient Hospital Stay (HOSPITAL_COMMUNITY): Payer: BC Managed Care – PPO

## 2019-02-22 ENCOUNTER — Encounter (HOSPITAL_COMMUNITY): Payer: Self-pay | Admitting: Obstetrics & Gynecology

## 2019-02-22 ENCOUNTER — Inpatient Hospital Stay (HOSPITAL_COMMUNITY)
Admission: AD | Admit: 2019-02-22 | Discharge: 2019-02-25 | DRG: 788 | Disposition: A | Discharge status: Home / Self Care | Payer: BC Managed Care – PPO | Attending: Obstetrics and Gynecology | Admitting: Obstetrics and Gynecology

## 2019-02-22 ENCOUNTER — Inpatient Hospital Stay (HOSPITAL_COMMUNITY): Payer: BC Managed Care – PPO | Admitting: Anesthesiology

## 2019-02-22 ENCOUNTER — Encounter (HOSPITAL_COMMUNITY)
Admission: AD | Disposition: A | Discharge status: Home / Self Care | Payer: Self-pay | Source: Home / Self Care | Attending: Obstetrics & Gynecology

## 2019-02-22 DIAGNOSIS — O358XX0 Maternal care for other (suspected) fetal abnormality and damage, not applicable or unspecified: Secondary | ICD-10-CM | POA: Diagnosis present

## 2019-02-22 DIAGNOSIS — Z2839 Other underimmunization status: Secondary | ICD-10-CM

## 2019-02-22 DIAGNOSIS — O321XX0 Maternal care for breech presentation, not applicable or unspecified: Secondary | ICD-10-CM | POA: Diagnosis present

## 2019-02-22 DIAGNOSIS — O365930 Maternal care for other known or suspected poor fetal growth, third trimester, not applicable or unspecified: Principal | ICD-10-CM | POA: Diagnosis present

## 2019-02-22 DIAGNOSIS — O34219 Maternal care for unspecified type scar from previous cesarean delivery: Secondary | ICD-10-CM | POA: Diagnosis present

## 2019-02-22 DIAGNOSIS — O34211 Maternal care for low transverse scar from previous cesarean delivery: Secondary | ICD-10-CM | POA: Diagnosis present

## 2019-02-22 DIAGNOSIS — O09299 Supervision of pregnancy with other poor reproductive or obstetric history, unspecified trimester: Secondary | ICD-10-CM

## 2019-02-22 DIAGNOSIS — O09899 Supervision of other high risk pregnancies, unspecified trimester: Secondary | ICD-10-CM

## 2019-02-22 DIAGNOSIS — O365990 Maternal care for other known or suspected poor fetal growth, unspecified trimester, not applicable or unspecified: Secondary | ICD-10-CM | POA: Diagnosis present

## 2019-02-22 DIAGNOSIS — O9902 Anemia complicating childbirth: Secondary | ICD-10-CM | POA: Diagnosis present

## 2019-02-22 DIAGNOSIS — Z8759 Personal history of other complications of pregnancy, childbirth and the puerperium: Secondary | ICD-10-CM

## 2019-02-22 DIAGNOSIS — O0990 Supervision of high risk pregnancy, unspecified, unspecified trimester: Secondary | ICD-10-CM

## 2019-02-22 DIAGNOSIS — D649 Anemia, unspecified: Secondary | ICD-10-CM | POA: Diagnosis present

## 2019-02-22 DIAGNOSIS — Z3A30 30 weeks gestation of pregnancy: Secondary | ICD-10-CM

## 2019-02-22 DIAGNOSIS — Z20822 Contact with and (suspected) exposure to covid-19: Secondary | ICD-10-CM | POA: Diagnosis present

## 2019-02-22 DIAGNOSIS — O99011 Anemia complicating pregnancy, first trimester: Secondary | ICD-10-CM | POA: Diagnosis present

## 2019-02-22 LAB — CBC
HCT: 37.1 % (ref 36.0–46.0)
Hemoglobin: 11.9 g/dL — ABNORMAL LOW (ref 12.0–15.0)
MCH: 24.8 pg — ABNORMAL LOW (ref 26.0–34.0)
MCHC: 32.1 g/dL (ref 30.0–36.0)
MCV: 77.5 fL — ABNORMAL LOW (ref 80.0–100.0)
Platelets: 387 10*3/uL (ref 150–400)
RBC: 4.79 MIL/uL (ref 3.87–5.11)
RDW: 16.6 % — ABNORMAL HIGH (ref 11.5–15.5)
WBC: 9.7 10*3/uL (ref 4.0–10.5)
nRBC: 0 % (ref 0.0–0.2)

## 2019-02-22 LAB — TYPE AND SCREEN
ABO/RH(D): O POS
Antibody Screen: NEGATIVE

## 2019-02-22 LAB — SARS CORONAVIRUS 2 (TAT 6-24 HRS): SARS Coronavirus 2: NEGATIVE

## 2019-02-22 LAB — ABO/RH: ABO/RH(D): O POS

## 2019-02-22 SURGERY — Surgical Case
Anesthesia: Spinal

## 2019-02-22 MED ORDER — ACETAMINOPHEN 325 MG PO TABS: 650.0000 mg | ORAL_TABLET | ORAL | Status: DC | PRN

## 2019-02-22 MED ORDER — KETOROLAC TROMETHAMINE 30 MG/ML IJ SOLN: 30.0000 mg | Freq: Four times a day (QID) | INTRAMUSCULAR | Status: AC | PRN

## 2019-02-22 MED ORDER — CEFAZOLIN SODIUM-DEXTROSE 2-3 GM-%(50ML) IV SOLR
INTRAVENOUS | Status: DC | PRN
  Administered 2019-02-22: 2 g via INTRAVENOUS

## 2019-02-22 MED ORDER — SODIUM CHLORIDE 0.9 % IR SOLN
Status: DC | PRN
  Administered 2019-02-22: 1000 mL

## 2019-02-22 MED ORDER — PRENATAL MULTIVITAMIN CH
1.0000 | ORAL_TABLET | Freq: Every day | ORAL | Status: DC
  Administered 2019-02-23 – 2019-02-24 (×2): 1 via ORAL
  Filled 2019-02-22 (×2): qty 1

## 2019-02-22 MED ORDER — KETOROLAC TROMETHAMINE 30 MG/ML IJ SOLN
INTRAMUSCULAR | Status: AC
  Filled 2019-02-22: qty 1

## 2019-02-22 MED ORDER — SIMETHICONE 80 MG PO CHEW: 80.0000 mg | CHEWABLE_TABLET | ORAL | Status: DC | PRN

## 2019-02-22 MED ORDER — NALBUPHINE HCL 10 MG/ML IJ SOLN: 5.0000 mg | INTRAMUSCULAR | Status: DC | PRN

## 2019-02-22 MED ORDER — BETAMETHASONE SOD PHOS & ACET 6 (3-3) MG/ML IJ SUSP
12.0000 mg | INTRAMUSCULAR | Status: DC
  Administered 2019-02-22: 12 mg via INTRAMUSCULAR
  Filled 2019-02-22: qty 5

## 2019-02-22 MED ORDER — ACETAMINOPHEN 500 MG PO TABS
1000.0000 mg | ORAL_TABLET | Freq: Four times a day (QID) | ORAL | Status: AC
  Administered 2019-02-23 (×3): 1000 mg via ORAL
  Filled 2019-02-22 (×3): qty 2

## 2019-02-22 MED ORDER — FENTANYL CITRATE (PF) 100 MCG/2ML IJ SOLN: 25.0000 ug | INTRAMUSCULAR | Status: DC | PRN

## 2019-02-22 MED ORDER — BUPIVACAINE IN DEXTROSE 0.75-8.25 % IT SOLN
INTRATHECAL | Status: DC | PRN
  Administered 2019-02-22: 1.6 mL via INTRATHECAL

## 2019-02-22 MED ORDER — ZOLPIDEM TARTRATE 5 MG PO TABS: 5.0000 mg | ORAL_TABLET | Freq: Every evening | ORAL | Status: DC | PRN

## 2019-02-22 MED ORDER — IBUPROFEN 800 MG PO TABS
800.0000 mg | ORAL_TABLET | Freq: Three times a day (TID) | ORAL | Status: DC
  Administered 2019-02-23 – 2019-02-25 (×7): 800 mg via ORAL
  Filled 2019-02-22 (×7): qty 1

## 2019-02-22 MED ORDER — CALCIUM CARBONATE ANTACID 500 MG PO CHEW: 2.0000 | CHEWABLE_TABLET | ORAL | Status: DC | PRN

## 2019-02-22 MED ORDER — ACETAMINOPHEN 10 MG/ML IV SOLN: 1000.0000 mg | Freq: Once | INTRAVENOUS | Status: DC | PRN

## 2019-02-22 MED ORDER — BUPIVACAINE HCL (PF) 0.5 % IJ SOLN
INTRAMUSCULAR | Status: DC | PRN
  Administered 2019-02-22: 30 mL

## 2019-02-22 MED ORDER — DOCUSATE SODIUM 100 MG PO CAPS
100.0000 mg | ORAL_CAPSULE | Freq: Every day | ORAL | Status: DC
  Administered 2019-02-23 – 2019-02-25 (×3): 100 mg via ORAL
  Filled 2019-02-22 (×3): qty 1

## 2019-02-22 MED ORDER — WITCH HAZEL-GLYCERIN EX PADS: 1.0000 "application " | MEDICATED_PAD | CUTANEOUS | Status: DC | PRN

## 2019-02-22 MED ORDER — SCOPOLAMINE 1 MG/3DAYS TD PT72: 1.0000 | MEDICATED_PATCH | Freq: Once | TRANSDERMAL | Status: DC

## 2019-02-22 MED ORDER — DIPHENHYDRAMINE HCL 25 MG PO CAPS: 25.0000 mg | ORAL_CAPSULE | ORAL | Status: DC | PRN

## 2019-02-22 MED ORDER — KETOROLAC TROMETHAMINE 30 MG/ML IJ SOLN
30.0000 mg | Freq: Once | INTRAMUSCULAR | Status: AC | PRN
  Administered 2019-02-22: 30 mg via INTRAVENOUS

## 2019-02-22 MED ORDER — DIBUCAINE (PERIANAL) 1 % EX OINT: 1.0000 "application " | TOPICAL_OINTMENT | CUTANEOUS | Status: DC | PRN

## 2019-02-22 MED ORDER — DIPHENHYDRAMINE HCL 50 MG/ML IJ SOLN: 12.5000 mg | INTRAMUSCULAR | Status: DC | PRN

## 2019-02-22 MED ORDER — BUPIVACAINE HCL (PF) 0.5 % IJ SOLN
INTRAMUSCULAR | Status: AC
  Filled 2019-02-22: qty 30

## 2019-02-22 MED ORDER — PRENATAL MULTIVITAMIN CH: 1.0000 | ORAL_TABLET | Freq: Every day | ORAL | Status: DC

## 2019-02-22 MED ORDER — OXYTOCIN 40 UNITS IN NORMAL SALINE INFUSION - SIMPLE MED
INTRAVENOUS | Status: DC | PRN
  Administered 2019-02-22: 40 [IU] via INTRAVENOUS

## 2019-02-22 MED ORDER — IBUPROFEN 800 MG PO TABS: 800.0000 mg | ORAL_TABLET | Freq: Three times a day (TID) | ORAL | Status: DC

## 2019-02-22 MED ORDER — ONDANSETRON HCL 4 MG/2ML IJ SOLN: 4.0000 mg | Freq: Three times a day (TID) | INTRAMUSCULAR | Status: DC | PRN

## 2019-02-22 MED ORDER — NALOXONE HCL 0.4 MG/ML IJ SOLN: 0.4000 mg | INTRAMUSCULAR | Status: DC | PRN

## 2019-02-22 MED ORDER — MENTHOL 3 MG MT LOZG: 1.0000 | LOZENGE | OROMUCOSAL | Status: DC | PRN

## 2019-02-22 MED ORDER — LACTATED RINGERS IV SOLN: INTRAVENOUS | Status: DC

## 2019-02-22 MED ORDER — ONDANSETRON HCL 4 MG/2ML IJ SOLN
INTRAMUSCULAR | Status: DC | PRN
  Administered 2019-02-22: 4 mg via INTRAVENOUS

## 2019-02-22 MED ORDER — NALBUPHINE HCL 10 MG/ML IJ SOLN: 5.0000 mg | Freq: Once | INTRAMUSCULAR | Status: DC | PRN

## 2019-02-22 MED ORDER — OXYTOCIN 40 UNITS IN NORMAL SALINE INFUSION - SIMPLE MED: 2.5000 [IU]/h | INTRAVENOUS | Status: AC

## 2019-02-22 MED ORDER — SENNOSIDES-DOCUSATE SODIUM 8.6-50 MG PO TABS
2.0000 | ORAL_TABLET | ORAL | Status: DC
  Administered 2019-02-23 – 2019-02-24 (×2): 2 via ORAL
  Filled 2019-02-22 (×3): qty 2

## 2019-02-22 MED ORDER — SIMETHICONE 80 MG PO CHEW
80.0000 mg | CHEWABLE_TABLET | ORAL | Status: DC
  Administered 2019-02-23 – 2019-02-24 (×2): 80 mg via ORAL
  Filled 2019-02-22 (×3): qty 1

## 2019-02-22 MED ORDER — SIMETHICONE 80 MG PO CHEW
80.0000 mg | CHEWABLE_TABLET | Freq: Three times a day (TID) | ORAL | Status: DC
  Administered 2019-02-23 – 2019-02-25 (×7): 80 mg via ORAL
  Filled 2019-02-22 (×8): qty 1

## 2019-02-22 MED ORDER — COCONUT OIL OIL: 1.0000 "application " | TOPICAL_OIL | Status: DC | PRN

## 2019-02-22 MED ORDER — TETANUS-DIPHTH-ACELL PERTUSSIS 5-2.5-18.5 LF-MCG/0.5 IM SUSP: 0.5000 mL | Freq: Once | INTRAMUSCULAR | Status: DC

## 2019-02-22 MED ORDER — MORPHINE SULFATE (PF) 0.5 MG/ML IJ SOLN
INTRAMUSCULAR | Status: DC | PRN
  Administered 2019-02-22: 150 ug via EPIDURAL

## 2019-02-22 MED ORDER — SOD CITRATE-CITRIC ACID 500-334 MG/5ML PO SOLN
ORAL | Status: AC
  Administered 2019-02-22: 30 mL
  Filled 2019-02-22: qty 15

## 2019-02-22 MED ORDER — NALOXONE HCL 4 MG/10ML IJ SOLN
1.0000 ug/kg/h | INTRAVENOUS | Status: DC | PRN
  Filled 2019-02-22: qty 5

## 2019-02-22 MED ORDER — DIPHENHYDRAMINE HCL 25 MG PO CAPS: 25.0000 mg | ORAL_CAPSULE | Freq: Four times a day (QID) | ORAL | Status: DC | PRN

## 2019-02-22 MED ORDER — PHENYLEPHRINE HCL-NACL 20-0.9 MG/250ML-% IV SOLN
INTRAVENOUS | Status: DC | PRN
  Administered 2019-02-22: 80 ug/min via INTRAVENOUS

## 2019-02-22 MED ORDER — SODIUM CHLORIDE 0.9 % IV SOLN: INTRAVENOUS | Status: DC | PRN

## 2019-02-22 MED ORDER — OXYCODONE-ACETAMINOPHEN 5-325 MG PO TABS
1.0000 | ORAL_TABLET | ORAL | Status: DC | PRN
  Administered 2019-02-24: 1 via ORAL
  Filled 2019-02-22: qty 1

## 2019-02-22 MED ORDER — MAGNESIUM SULFATE 40 GM/1000ML IV SOLN
2.0000 g/h | INTRAVENOUS | Status: DC
  Administered 2019-02-22: 2 g/h via INTRAVENOUS
  Filled 2019-02-22: qty 1000

## 2019-02-22 MED ORDER — MAGNESIUM SULFATE BOLUS VIA INFUSION
4.0000 g | Freq: Once | INTRAVENOUS | Status: AC
  Administered 2019-02-22: 4 g via INTRAVENOUS
  Filled 2019-02-22: qty 1000

## 2019-02-22 MED ORDER — FENTANYL CITRATE (PF) 100 MCG/2ML IJ SOLN
INTRAMUSCULAR | Status: DC | PRN
  Administered 2019-02-22: 15 ug via INTRAVENOUS

## 2019-02-22 MED ORDER — SODIUM CHLORIDE 0.9% FLUSH: 3.0000 mL | INTRAVENOUS | Status: DC | PRN

## 2019-02-22 MED ORDER — LACTATED RINGERS IV SOLN: INTRAVENOUS | Status: DC | PRN

## 2019-02-22 SURGICAL SUPPLY — 42 items
BARRIER ADHS 3X4 INTERCEED (GAUZE/BANDAGES/DRESSINGS) IMPLANT
BENZOIN TINCTURE PRP APPL 2/3 (GAUZE/BANDAGES/DRESSINGS) ×3 IMPLANT
CHLORAPREP W/TINT 26ML (MISCELLANEOUS) ×3 IMPLANT
CLAMP CORD UMBIL (MISCELLANEOUS) IMPLANT
CLOSURE WOUND 1/2 X4 (GAUZE/BANDAGES/DRESSINGS) ×1
DRSG OPSITE POSTOP 4X10 (GAUZE/BANDAGES/DRESSINGS) ×3 IMPLANT
ELECT REM PT RETURN 9FT ADLT (ELECTROSURGICAL) ×3
ELECTRODE REM PT RTRN 9FT ADLT (ELECTROSURGICAL) ×1 IMPLANT
EXTRACTOR VACUUM KIWI (MISCELLANEOUS) IMPLANT
GAUZE SPONGE 4X4 12PLY STRL LF (GAUZE/BANDAGES/DRESSINGS) ×6 IMPLANT
GLOVE BIO SURGEON STRL SZ 6.5 (GLOVE) ×2 IMPLANT
GLOVE BIO SURGEONS STRL SZ 6.5 (GLOVE) ×1
GLOVE BIOGEL PI IND STRL 7.0 (GLOVE) ×1 IMPLANT
GLOVE BIOGEL PI INDICATOR 7.0 (GLOVE) ×2
GOWN STRL REUS W/ TWL LRG LVL3 (GOWN DISPOSABLE) ×2 IMPLANT
GOWN STRL REUS W/TWL LRG LVL3 (GOWN DISPOSABLE) ×4
HEMOSTAT ARISTA ABSORB 3G PWDR (HEMOSTASIS) ×3 IMPLANT
KIT ABG SYR 3ML LUER SLIP (SYRINGE) IMPLANT
NEEDLE HYPO 25X5/8 SAFETYGLIDE (NEEDLE) IMPLANT
NEEDLE SPNL 18GX3.5 QUINCKE PK (NEEDLE) ×3 IMPLANT
NS IRRIG 1000ML POUR BTL (IV SOLUTION) ×3 IMPLANT
PACK C SECTION WH (CUSTOM PROCEDURE TRAY) ×3 IMPLANT
PAD ABD 7.5X8 STRL (GAUZE/BANDAGES/DRESSINGS) ×3 IMPLANT
PAD OB MATERNITY 4.3X12.25 (PERSONAL CARE ITEMS) ×3 IMPLANT
PENCIL SMOKE EVAC W/HOLSTER (ELECTROSURGICAL) ×3 IMPLANT
SPONGE LAP 18X18 RF (DISPOSABLE) ×9 IMPLANT
STRIP CLOSURE SKIN 1/2X4 (GAUZE/BANDAGES/DRESSINGS) ×2 IMPLANT
SUT PDS AB 0 CTX 60 (SUTURE) ×3 IMPLANT
SUT VIC AB 0 CT1 27 (SUTURE)
SUT VIC AB 0 CT1 27XBRD ANBCTR (SUTURE) IMPLANT
SUT VIC AB 0 CT1 36 (SUTURE) IMPLANT
SUT VIC AB 2-0 CT1 27 (SUTURE) ×2
SUT VIC AB 2-0 CT1 TAPERPNT 27 (SUTURE) ×1 IMPLANT
SUT VIC AB 2-0 CTX 36 (SUTURE) ×9 IMPLANT
SUT VIC AB 3-0 CT1 27 (SUTURE) ×2
SUT VIC AB 3-0 CT1 TAPERPNT 27 (SUTURE) ×1 IMPLANT
SUT VIC AB 3-0 SH 27 (SUTURE)
SUT VIC AB 3-0 SH 27X BRD (SUTURE) IMPLANT
SYR 30ML LL (SYRINGE) ×3 IMPLANT
SYR 3ML 23GX1 SAFETY (SYRINGE) ×3 IMPLANT
TOWEL OR 17X24 6PK STRL BLUE (TOWEL DISPOSABLE) ×3 IMPLANT
TRAY FOLEY BAG SILVER LF 14FR (SET/KITS/TRAYS/PACK) ×3 IMPLANT

## 2019-02-22 NOTE — Transfer of Care (Signed)
Immediate Anesthesia Transfer of Care Note  Patient: Jordan Holt  Procedure(s) Performed: CESAREAN SECTION (N/A )  Patient Location: PACU  Anesthesia Type:Spinal  Level of Consciousness: awake, alert  and oriented  Airway & Oxygen Therapy: Patient Spontanous Breathing  Post-op Assessment: Report given to RN and Post -op Vital signs reviewed and stable  Post vital signs: Reviewed and stable  Last Vitals:  Vitals Value Taken Time  BP 106/74 02/22/19 1805  Temp 36.6 C 02/22/19 1805  Pulse 106 02/22/19 1814  Resp 19 02/22/19 1814  SpO2 100 % 02/22/19 1814  Vitals shown include unvalidated device data.  Last Pain:  Vitals:   02/22/19 1805  TempSrc: Oral  PainSc: 0-No pain         Complications: No apparent anesthesia complications

## 2019-02-22 NOTE — H&P (Signed)
Jordan Holt is a 33 y.o. female presenting for severe IUGR and reverse end diastolic flow. She was advised yesterday to go to [facility] for admission but she did not do so. Today she did agree to be admitted to [facility]. She denies contractions, bleeding, and contractions.  OB History    Gravida  3   Para  2   Term  1   Preterm  1   AB      Living  3     SAB      TAB      Ectopic      Multiple  1   Live Births  3          Past Medical History:  Diagnosis Date  . Anemia   . PCOS (polycystic ovarian syndrome)    Past Surgical History:  Procedure Laterality Date  . CESAREAN SECTION    . CESAREAN SECTION MULTI-GESTATIONAL  10/08/2015   Procedure: CESAREAN SECTION MULTI-GESTATIONAL;  Surgeon: Osborne Oman, MD;  Location: [place];  Service: Obstetrics;;   Family History: family history includes Diabetes in her mother; Healthy in her father. Social History:  reports that she has never smoked. She has never used smokeless tobacco. She reports that she does not drink alcohol or use drugs.     Maternal Diabetes: No Genetic Screening: Normal Maternal Ultrasounds/Referrals: IUGR Fetal Ultrasounds or other Referrals:  Referred to Materal Fetal Medicine  Maternal Substance Abuse:  No Significant Maternal Medications:  None except for iron and PNVs Significant Maternal Lab Results:  None Other Comments:  None  Review of Systems History   Blood pressure 118/81, pulse 95, resp. rate 17, last menstrual period 07/23/2018, SpO2 100 %, currently breastfeeding. Exam Physical Exam  Breathing, conversing, and ambulating normally Well nourished, well hydrated Black female, no apparent distress Heart- rrr Lungs- CTAB Abd- gravid, benign FHR with baseline 150s with good variability but recurrent decelerations for at least a minute at a time Prenatal labs: ABO, Rh: --/--/O POS, O POS Performed at [facility], [address],  [place], [place] [address]  [phone] 1430) Antibody: NEG (02/12 1430) Rubella: 2.69 (09/10 1546) RPR: Non Reactive (01/21 1020)  HBsAg: Negative (09/10 1546)  HIV: Non Reactive (01/21 1020)  GBS:     Assessment/Plan: Non-reassuring fetal status at 30 weeks and 4 days. She has had a full course of BMZ several weeks ago and another one today. She has been on IV magnesium for fetal neuroprophylaxis for several hours. Due to her h/o previous cesarean section and breech lie of fetus, she will need another cesarean now. I have explained that it might be classical incision on the uterus, depending on the development of the lower uterine segment.  She understands the risks of surgery, including, but not to infection, bleeding, DVTs, damage to bowel, bladder, ureters. She wishes to proceed.      Emily Filbert 02/22/2019, 4:51 PM

## 2019-02-22 NOTE — Anesthesia Preprocedure Evaluation (Signed)
Anesthesia Evaluation  Patient identified by MRN, date of birth, ID band Patient awake    Reviewed: Allergy & Precautions, NPO status , Patient's Chart, lab work & pertinent test results  Airway Mallampati: II  TM Distance: >3 FB Neck ROM: Full    Dental no notable dental hx.    Pulmonary neg pulmonary ROS,    Pulmonary exam normal breath sounds clear to auscultation       Cardiovascular negative cardio ROS Normal cardiovascular exam Rhythm:Regular Rate:Normal     Neuro/Psych negative neurological ROS  negative psych ROS   GI/Hepatic negative GI ROS, Neg liver ROS,   Endo/Other  negative endocrine ROSPCOS  Renal/GU negative Renal ROS  negative genitourinary   Musculoskeletal negative musculoskeletal ROS (+)   Abdominal   Peds  Hematology negative hematology ROS (+)   Anesthesia Other Findings [redacted] weeks gestation, severe IUGR, fetus reversed end diastolic flow, repeat C/S, breech presentation  Reproductive/Obstetrics (+) Pregnancy                             Anesthesia Physical Anesthesia Plan  ASA: III  Anesthesia Plan: Spinal   Post-op Pain Management:    Induction:   PONV Risk Score and Plan: Treatment may vary due to age or medical condition  Airway Management Planned: Natural Airway  Additional Equipment:   Intra-op Plan:   Post-operative Plan:   Informed Consent: I have reviewed the patients History and Physical, chart, labs and discussed the procedure including the risks, benefits and alternatives for the proposed anesthesia with the patient or authorized representative who has indicated his/her understanding and acceptance.     Dental advisory given  Plan Discussed with: CRNA  Anesthesia Plan Comments:         Anesthesia Quick Evaluation

## 2019-02-22 NOTE — Anesthesia Procedure Notes (Signed)
Spinal  Patient location during procedure: OR Start time: 02/22/2019 5:00 PM End time: 02/22/2019 5:10 PM Staffing Performed: anesthesiologist  Anesthesiologist: Freddrick March, MD Preanesthetic Checklist Completed: patient identified, IV checked, risks and benefits discussed, surgical consent, monitors and equipment checked, pre-op evaluation and timeout performed Spinal Block Patient position: sitting Prep: DuraPrep and site prepped and draped Patient monitoring: cardiac monitor, continuous pulse ox and blood pressure Approach: midline Location: L3-4 Injection technique: single-shot Needle Needle type: Pencan  Needle gauge: 24 G Needle length: 9 cm Assessment Sensory level: T6 Additional Notes Functioning IV was confirmed and monitors were applied. Sterile prep and drape, including hand hygiene and sterile gloves were used. The patient was positioned and the spine was prepped. The skin was anesthetized with lidocaine.  Free flow of clear CSF was obtained prior to injecting local anesthetic into the CSF.  The spinal needle aspirated freely following injection.  The needle was carefully withdrawn.  The patient tolerated the procedure well.

## 2019-02-22 NOTE — Discharge Summary (Signed)
Postpartum Discharge Summary    Patient Name: Jordan Holt DOB: Jan 06, 1986 MRN: [id number]  Date of admission: 02/22/2019 Delivering Provider: Merilyn Baba   Date of discharge: 02/25/2019  Admitting diagnosis: IUGR (intrauterine growth restriction) affecting care of mother [O36.5990] Intrauterine pregnancy: [redacted]w[redacted]d    Secondary diagnosis:  Principal Problem:   Delivery by classical cesarean section Active Problems:   Previous cesarean delivery affecting pregnancy, antepartum   Anemia affecting pregnancy in first trimester   Maternal varicella, non-immune   Hx of preeclampsia, prior pregnancy, currently pregnant   Pregnancy affected by fetal growth restriction   Supervision of high risk pregnancy, antepartum   History of gestational hypertension   IUGR (intrauterine growth restriction) affecting care of mother  Additional problems: None     Discharge diagnosis: Preterm Pregnancy Delivered and Classical Cesarean delivery                                                                                                Post partum procedures: None  Augmentation: None  Complications: None  Hospital course:  Sceduled C/S   34y.o. yo [id number] admitted to the hospital 02/22/2019 for scheduled cesarean section with the following indication:Non-Reassuring FHR/REDF.  Membrane Rupture Time/Date: 5:15 PM ,02/22/2019   Patient delivered a Viable infant.02/22/2019        Delivery time: 5:16 PM  Details of operation can be found in separate operative note.  Patient had an uncomplicated postpartum course.  She is ambulating, tolerating a regular diet, passing flatus, and urinating well. Patient is discharged home in stable condition on   Magnesium Sulfate received: No BMZ received: Yes (1/28, 1/29) Rhophylac:N/A MMR:N/A Transfusion:No  Physical exam  Vitals:   02/24/19 0800 02/24/19 1536 02/24/19 1959 02/25/19 0437  BP:  123/79 125/77 112/72  Pulse:  (!) 101 96 87   Resp:  16  17  Temp:  98.8 F (37.1 C)  98.1 F (36.7 C)  TempSrc:  Oral    SpO2: 100% 100%  100%  Weight:      Height:       General: alert, cooperative and no distress Lochia: appropriate Uterine Fundus: firm Incision: Healing well with no significant drainage, No significant erythema, Dressing is clean, dry, and intact DVT Evaluation: No evidence of DVT seen on physical exam. Negative Homan's sign. Labs: Lab Results  Component Value Date   WBC 15.3 (H) 02/23/2019   HGB 10.4 (L) 02/23/2019   HCT 31.9 (L) 02/23/2019   MCV 76.7 (L) 02/23/2019   PLT 371 02/23/2019   CMP Latest Ref Rng & Units 10/16/2018  Glucose 65 - 99 mg/dL 87  BUN 6 - 20 mg/dL 6  Creatinine 0.57 - 1.00 mg/dL 0.63  Sodium 134 - 144 mmol/L 137  Potassium 3.5 - 5.2 mmol/L 3.6  Chloride 96 - 106 mmol/L 102  CO2 20 - 29 mmol/L 21  Calcium 8.7 - 10.2 mg/dL 9.8  Total Protein 6.0 - 8.5 g/dL 6.6  Total Bilirubin 0.0 - 1.2 mg/dL <0.2  Alkaline Phos 39 - 117 IU/L 62  AST 0 - 40 IU/L  15  ALT 0 - 32 IU/L 26   Edinburgh Score: Edinburgh Postnatal Depression Scale Screening Tool 02/24/2019  I have been able to laugh and see the funny side of things. 0  I have looked forward with enjoyment to things. 0  I have blamed myself unnecessarily when things went wrong. 0  I have been anxious or worried for no good reason. 0  I have felt scared or panicky for no good reason. 0  Things have been getting on top of me. 1  I have been so unhappy that I have had difficulty sleeping. 1  I have felt sad or miserable. 1  I have been so unhappy that I have been crying. 1  The thought of harming myself has occurred to me. 0  Edinburgh Postnatal Depression Scale Total 4    Discharge instruction: per After Visit Summary and "Baby and Me Booklet".  After visit meds:  Allergies as of 02/25/2019   No Known Allergies     Medication List    TAKE these medications   ibuprofen 800 MG tablet Commonly known as: ADVIL Take 1  tablet (800 mg total) by mouth every 8 (eight) hours as needed for moderate pain or cramping.   Iron 325 (65 Fe) MG Tabs Take 1 tablet (325 mg total) by mouth daily.   norethindrone 0.35 MG tablet Commonly known as: MICRONOR Take 1 tablet (0.35 mg total) by mouth daily. Start after 2 weeks from delivery   oxyCODONE-acetaminophen 5-325 MG tablet Commonly known as: PERCOCET/ROXICET Take 1 tablet by mouth every 4 (four) hours as needed for moderate pain or severe pain.   prenatal multivitamin Tabs tablet Take 1 tablet by mouth daily at 12 noon.   senna-docusate 8.6-50 MG tablet Commonly known as: Senokot-S Take 2 tablets by mouth daily as needed for mild constipation or moderate constipation.            Discharge Care Instructions  (From admission, onward)         Start     Ordered   02/25/19 0000  Discharge wound care:    Comments: As per discharge handout and nursing instructions   02/25/19 0713          Diet: routine diet  Activity: Advance as tolerated. Pelvic rest for 6 weeks.   Outpatient follow up:4 weeks Follow up Appt: Future Appointments  Date Time Provider [place]  02/25/2019  9:15 AM Donnamae Jude, MD [place] [facility]OC  02/25/2019 10:45 AM WH-MFC NST [place] MFC-US  02/25/2019 11:30 AM [place] MFC-US  02/25/2019 11:30 AM WH-MFC [place] 5 WH-MFCUS MFC-US  02/28/2019  3:45 PM [place] MFC-US  02/28/2019  3:45 PM [place] 5 WH-MFCUS MFC-US   Follow up Visit: Please schedule this patient for Postpartum visit in: 4 weeks with the following provider: Any provider For C/S patients schedule nurse incision check in weeks 2 weeks: yes High risk pregnancy complicated by: h/o gHTN, severe IUGR (reverse end diastolic flow) Delivery mode:  CS- classical Anticipated Birth Control:  POPs PP Procedures needed: Incision check  Schedule Integrated BH visit: no  Newborn Data: Live born female  Birth Weight: 1 lb 2.7 oz (530 g) APGAR: 1,  7  Newborn Delivery   Birth date/time: 02/22/2019 17:16:00 Delivery type: C-Section, Classical Trial of labor: No C-section categorization: Repeat      Baby Feeding: Bottle and Breast Disposition:NICU   02/25/2019 Verita Schneiders, MD

## 2019-02-22 NOTE — Progress Notes (Signed)
Call to Dr. Hulan Fray re: pt's admission

## 2019-02-22 NOTE — Progress Notes (Addendum)
Dr. Hulan Fray at bedside. Pt. Has been moving and pt. Is prepped for c/s. Consent signed and taken to the OR. Anesthesia & OB OR RN were called due to plan to proceed with c/s. Anesthesia was at the bedside for consent and assessment as OBSC staff began preparing patient for OR.   RN unable to trace FHTs  After C/S prep,  Dr. Hulan Fray made aware and proceed with Code C/s.   *NICU Charge RN previously called. ~ 1647., when decision to proceed with original c/s plan was made. *  Upon admission and during time on OB pt. Did not complain of ctx, vag bld, or drainage. She recvd. I dose of BMZ.   Pt. Recvd. [place] x 2 prior to transport to the OR  In the OR report given to CRNA of pt's NPO status since yesterday 2/11 @ 15:30, NKA and start of Mag bolus/gtt  Fetal HTs assessed by Dr. Hulan Fray in [place] and 147 by doppler

## 2019-02-22 NOTE — Discharge Instructions (Signed)

## 2019-02-22 NOTE — Progress Notes (Signed)
Pt. To floor around 1:22pm. Call to Dr. Hulan Fray and was given new orders. Pt. Was getting settled in her room. I placed the orders and gathered supplies to get Jordan Holt started on her Mag gtt.  13:50 I placed pt on monitor, I called for Second RN eval as it seemed that I was picking up Maternal FHTs. At 13:56 we called Dr. Hulan Fray for BS ultrasound,  14:00 IV placed 14:05US at Bedside, Dr. Hulan Fray requests [place] tech to bedside 14:10 ultrasound to room 14:15 FHTs found  To be 157 (labs drawn, consent for c/s with indications and risks by Dr. Hulan Fray)  14:34 4G Mag bolus

## 2019-02-23 DIAGNOSIS — Z3A30 30 weeks gestation of pregnancy: Secondary | ICD-10-CM

## 2019-02-23 DIAGNOSIS — O365930 Maternal care for other known or suspected poor fetal growth, third trimester, not applicable or unspecified: Secondary | ICD-10-CM

## 2019-02-23 LAB — CBC
HCT: 31.9 % — ABNORMAL LOW (ref 36.0–46.0)
Hemoglobin: 10.4 g/dL — ABNORMAL LOW (ref 12.0–15.0)
MCH: 25 pg — ABNORMAL LOW (ref 26.0–34.0)
MCHC: 32.6 g/dL (ref 30.0–36.0)
MCV: 76.7 fL — ABNORMAL LOW (ref 80.0–100.0)
Platelets: 371 10*3/uL (ref 150–400)
RBC: 4.16 MIL/uL (ref 3.87–5.11)
RDW: 16.5 % — ABNORMAL HIGH (ref 11.5–15.5)
WBC: 15.3 10*3/uL — ABNORMAL HIGH (ref 4.0–10.5)
nRBC: 0 % (ref 0.0–0.2)

## 2019-02-23 NOTE — Lactation Note (Signed)
This note was copied from a baby's chart. Lactation Consultation Note  Patient Name: Jordan Holt [id number] Date: 02/23/2019 Reason for consult: Initial assessment;NICU baby;Preterm <34wks  LC in to visit with P4 Mom of IUGR Preterm baby in the NICU.  Baby 65 hrs old and on jet ventilator.  Mom has a DEBP set up and states she has pumped 3 times so far.  Mom encouraged to pump >8 times per 24 hrs.  Reviewed breast massage and hand expression and provided Mom with colostrum containers and stickers to number her colostrum containers to take to NICU.  Reassured Mom that pumping regularly will stimulate her milk to come to volume.  Encouraged going to NICU often and pumping after visiting with baby.  Mom informed of pump in the baby's room.   Mom has [place] in [place].  Request faxed to [facility] for pump on discharge.    Lactation brochure and NICU booklet given to Mom and encouraged her to read and ask questions prn.    Interventions Interventions: Breast feeding basics reviewed;Breast massage;Hand express;DEBP  Lactation Tools Discussed/Used Tools: Pump Breast pump type: Double-Electric Breast Pump WIC Program: Yes Pump Review: Setup, frequency, and cleaning;Milk Storage Initiated by:: RN Date initiated:: 02/23/19   Consult Status Consult Status: Follow-up Date: 02/24/19 Follow-up type: In-patient    Jordan Holt 02/23/2019, 1:25 PM

## 2019-02-23 NOTE — Op Note (Signed)
02/22/2019  3:57 AM  PATIENT:  Jordan Holt  34 y.o. female  PRE-OPERATIVE DIAGNOSIS:  IUGR REDF at 30.5 weeks  POST-OPERATIVE DIAGNOSIS:  same  PROCEDURE:  Procedure(s): CESAREAN SECTION (N/A)  SURGEON:  Surgeon(s) and Role:    * Darrion Wyszynski C, MD - Primary    * Sparacino, Hailey L, DO - Fellow   ANESTHESIA:   local and spinal  EBL:  Less than 500 cc by my estimation  BLOOD ADMINISTERED:none  DRAINS: Urinary Catheter (Foley)   LOCAL MEDICATIONS USED:  MARCAINE     SPECIMEN:  Source of Specimen:  cord blood, cord blood gas, placenta  DISPOSITION OF SPECIMEN:  PATHOLOGY  COUNTS:  YES  TOURNIQUET:  * No tourniquets in log *  DICTATION: .Dragon Dictation  PLAN OF CARE: Admit to inpatient   PATIENT DISPOSITION:  PACU - hemodynamically stable.   Delay start of Pharmacological VTE agent (>24hrs) due to surgical blood loss or risk of bleeding: not applicable  The risks, benefits, and alternatives of surgery were explained, understood, accepted. Consents were signed. All questions were answered. In the operating room spinal anesthesia was applied without complication. Her abdomen and vagina were prepped and draped in the usual sterile fashion. A Foley catheter was placed, draining clear urine throughout case. Timeout procedure was done. After adequate anesthesia was assured an incision was made through the previous incision. The incision was carried down through the subcutaneous tissue to the fascia. The fascia was scored the midline and extended bilaterally. The fascia was elevated off of the underlying rectus muscles. The muscles were pulled to each side allowing access to the uterus. Excellent hemostasis was maintained. The peritoneum was entered with hemostats. Peritoneal incision was extended bilaterally with the Bovie. The bladder blade was placed. The lower uterine segment was poorly developed, very small, necessitating a vertical hysterotomy incision. Excellent hemostasis  was maintained.  Amniotomy was performed with a hemostat. Meconium fluid was noted. The baby was delivered from a breech presentation, having a bowel movement during delivery.  The baby's cord was clamped and cut and was transferred to the NICU personnel for care. The placenta was delivered intact with traction. The uterus was left in situ and the interior was cleaned with a dry lap sponge. The uterine incision was closed with 2 layers of  2-0 Vicryl running locking suture, the second layer imbricating the first. Arista was placed over the incision. Excellent hemostasis was noted. By tilting the uterus each side was able to visualize the adnexa, and they were normal. The rectus fascia rectus muscles were noted be hemostatic as well. The fascia was closed with a #1 PDS loop in a running nonlocking fashion. No defects were palpable.  A subcuticular closure was done with a 3-0 Vicryl suture. Steri-Strips are placed. Excellent cosmetic results were obtained. She was taken to the recovery room in stable condition. She tolerated the procedure well.

## 2019-02-23 NOTE — Progress Notes (Signed)
Subjective: Postpartum Day 1: Cesarean Delivery Patient reports tolerating PO. She has been to NICU via wheelchair this morning.   Objective: Vital signs in last 24 hours: Temp:  [97.8 F (36.6 C)-98.6 F (37 C)] 98.1 F (36.7 C) (02/13 0400) Pulse Rate:  [40-108] 99 (02/13 0400) Resp:  [16-22] 17 (02/13 0400) BP: (106-129)/(65-86) 116/65 (02/13 0400) SpO2:  [95 %-100 %] 99 % (02/12 2020) Weight:  [79 kg] 79 kg (02/12 2318)  Physical Exam:  General: alert Lochia: appropriate Uterine Fundus: firm Incision: dressing clean, dry, intact DVT Evaluation: No evidence of DVT seen on physical exam.  Recent Labs    02/22/19 1429  HGB 11.9*  HCT 37.1    Assessment/Plan: Status post Cesarean section. Doing well postoperatively.  Continue current care.  Emily Filbert 02/23/2019, 5:22 AM

## 2019-02-24 NOTE — Progress Notes (Signed)
Subjective: Postpartum Day 2: Cesarean Delivery Patient reports feeling well. She is ambulating, voiding and tolerating a regular diet.    Objective: Vital signs in last 24 hours: Temp:  [97.7 F (36.5 C)-98.5 F (36.9 C)] 98.2 F (36.8 C) (02/13 2323) Pulse Rate:  [84-107] 92 (02/13 2323) Resp:  [17-18] 18 (02/13 2323) BP: (114-125)/(65-78) 114/65 (02/13 2323) SpO2:  [100 %] 100 % (02/13 2323)  Physical Exam:  General: alert, cooperative and no distress Lochia: appropriate Uterine Fundus: firm Incision: dressing clean, dry and intact DVT Evaluation: No evidence of DVT seen on physical exam.  Recent Labs    02/22/19 1429 02/23/19 0529  HGB 11.9* 10.4*  HCT 37.1 31.9*    Assessment/Plan: Status post Cesarean section. Doing well postoperatively.  Continue current care Discharge home tomorrow.  Jordan Holt 02/24/2019, 7:40 AM

## 2019-02-24 NOTE — Progress Notes (Signed)
Set up with DBEP, pt pumped.

## 2019-02-24 NOTE — Lactation Note (Signed)
This note was copied from a baby's chart. Lactation Consultation Note  Patient Name: Jordan Holt [id number] Date: 02/24/2019 Reason for consult: Follow-up assessment;NICU baby;Preterm <34wks;Infant < 6lbs  LC in to visit with P4 Mom of infant in the NICU.  Baby 70 hrs old.  Mom has been pumping and collected some colostrum to take to NICU for baby.  Mom was sad as some of the colostrum spilled on her bed.  Praised her for pumping and providing small amounts of colostrum for baby.  Encouraged pumping every 2-3 hrs.  Disassembled pump parts, washed, rinsed and placed in separate bin for air drying.    Mom states her [facility] is in [facility] as she just moved to [place].  Faxed request to [facility] for pump on discharge.   Mom denies any questions currently.  Interventions Interventions: Breast massage;Hand express;DEBP  Lactation Tools Discussed/Used Tools: Pump Breast pump type: Double-Electric Breast Pump WIC Program: Yes   Consult Status Consult Status: Follow-up Date: 02/25/19 Follow-up type: In-patient    Broadus John 02/24/2019, 1:58 PM

## 2019-02-25 ENCOUNTER — Ambulatory Visit (HOSPITAL_COMMUNITY): Admission: RE | Admit: 2019-02-25 | Payer: BC Managed Care – PPO | Source: Ambulatory Visit

## 2019-02-25 ENCOUNTER — Other Ambulatory Visit (HOSPITAL_COMMUNITY): Payer: BC Managed Care – PPO

## 2019-02-25 ENCOUNTER — Encounter: Payer: BC Managed Care – PPO | Admitting: Family Medicine

## 2019-02-25 ENCOUNTER — Ambulatory Visit (HOSPITAL_COMMUNITY): Payer: BC Managed Care – PPO

## 2019-02-25 ENCOUNTER — Encounter (HOSPITAL_COMMUNITY): Payer: Self-pay | Admitting: Obstetrics & Gynecology

## 2019-02-25 MED ORDER — SENNOSIDES-DOCUSATE SODIUM 8.6-50 MG PO TABS: 2.0000 | ORAL_TABLET | Freq: Every day | ORAL | 2 refills | Status: DC | PRN

## 2019-02-25 MED ORDER — NORETHINDRONE 0.35 MG PO TABS: 1.0000 | ORAL_TABLET | Freq: Every day | ORAL | 11 refills | Status: DC

## 2019-02-25 MED ORDER — OXYCODONE-ACETAMINOPHEN 5-325 MG PO TABS: 1.0000 | ORAL_TABLET | ORAL | 0 refills | Status: DC | PRN

## 2019-02-25 MED ORDER — IBUPROFEN 800 MG PO TABS: 800.0000 mg | ORAL_TABLET | Freq: Three times a day (TID) | ORAL | 2 refills | Status: DC | PRN

## 2019-02-25 NOTE — Anesthesia Postprocedure Evaluation (Signed)
Anesthesia Post Note  Patient: Jordan Holt  Procedure(s) Performed: CESAREAN SECTION (N/A )     Patient location during evaluation: PACU Anesthesia Type: Spinal Level of consciousness: oriented and awake and alert Pain management: pain level controlled Vital Signs Assessment: post-procedure vital signs reviewed and stable Respiratory status: spontaneous breathing, respiratory function stable and patient connected to nasal cannula oxygen Cardiovascular status: blood pressure returned to baseline and stable Postop Assessment: no headache, no backache and no apparent nausea or vomiting Anesthetic complications: no    Last Vitals:  Vitals:   02/25/19 0437 02/25/19 0747  BP: 112/72 106/74  Pulse: 87 85  Resp: 17 16  Temp: 36.7 C 36.7 C  SpO2: 100% 100%    Last Pain:  Vitals:   02/25/19 0747  TempSrc: Oral  PainSc:                  Camala Talwar L Shirleymae Hauth

## 2019-02-25 NOTE — Lactation Note (Signed)
This note was copied from a baby's chart. Lactation Consultation Note  Patient Name: Boy Tali Havens [id number] Date: 02/25/2019  Randel Books is 54 hours in the NICU.  Breasts are becoming full but not engorged.  Mom is obtaining milk but she is unsure how much.  Milk has been taken to the NICU.  Stressed importance of pumping every 3 hours to establish and maintain a good milk supply.  Mom states "II try".  [facility] just called mom and has a pump ready for pick up today.  Instructed to bring all her pump pieces home and bring to hospital when visiting baby.  Encouraged to call with concerns.   Maternal Data    Feeding    LATCH Score                   Interventions    Lactation Tools Discussed/Used     Consult Status      Ave Filter 02/25/2019, 9:25 AM

## 2019-02-26 LAB — SURGICAL PATHOLOGY

## 2019-02-28 ENCOUNTER — Ambulatory Visit (HOSPITAL_COMMUNITY): Payer: BC Managed Care – PPO

## 2019-03-01 ENCOUNTER — Other Ambulatory Visit: Payer: Self-pay

## 2019-03-01 ENCOUNTER — Encounter: Payer: Self-pay | Admitting: Emergency Medicine

## 2019-03-01 ENCOUNTER — Telehealth (INDEPENDENT_AMBULATORY_CARE_PROVIDER_SITE_OTHER): Payer: BC Managed Care – PPO | Admitting: Family Medicine

## 2019-03-01 ENCOUNTER — Emergency Department
Admission: EM | Admit: 2019-03-01 | Discharge: 2019-03-01 | Disposition: A | Discharge status: Home / Self Care | Payer: BC Managed Care – PPO | Attending: Emergency Medicine | Admitting: Emergency Medicine

## 2019-03-01 DIAGNOSIS — T8130XA Disruption of wound, unspecified, initial encounter: Secondary | ICD-10-CM

## 2019-03-01 DIAGNOSIS — Z5189 Encounter for other specified aftercare: Secondary | ICD-10-CM

## 2019-03-01 DIAGNOSIS — Z79899 Other long term (current) drug therapy: Secondary | ICD-10-CM | POA: Diagnosis not present

## 2019-03-01 DIAGNOSIS — O900 Disruption of cesarean delivery wound: Secondary | ICD-10-CM | POA: Insufficient documentation

## 2019-03-01 NOTE — ED Provider Notes (Signed)
Emergency Department Provider Note  ____________________________________________  Time seen: Approximately 11:16 PM  I have reviewed the triage vital signs and the nursing notes.   HISTORY  Chief Complaint Post-op Problem   Historian Patient     HPI Jordan Holt is a 34 y.o. female presents to the emergency department for a wound check.  Patient had a C-section 1 week ago and noticed a 3 cm region of dehiscence.  No active bleeding.  Patient denies abdominal or pelvic pain.  No other alleviating measures have been attempted.   Past Medical History:  Diagnosis Date  . Anemia   . PCOS (polycystic ovarian syndrome)      Immunizations up to date:  Yes.     Past Medical History:  Diagnosis Date  . Anemia   . PCOS (polycystic ovarian syndrome)     Patient Active Problem List   Diagnosis Date Noted  . IUGR (intrauterine growth restriction) affecting care of mother 02/22/2019  . Delivery by classical cesarean section 02/22/2019  . Supervision of high risk pregnancy, antepartum 01/15/2019  . History of gestational hypertension 01/15/2019  . Hx of preeclampsia, prior pregnancy, currently pregnant 12/20/2018  . Pregnancy affected by fetal growth restriction 12/20/2018  . Ptyalism 10/30/2018  . Anemia affecting pregnancy in first trimester 10/16/2018  . History of twin pregnancy in prior pregnancy 10/16/2018  . Maternal varicella, non-immune 10/16/2018  . Previous cesarean delivery affecting pregnancy, antepartum 10/08/2015    Past Surgical History:  Procedure Laterality Date  . CESAREAN SECTION    . CESAREAN SECTION N/A 02/22/2019   Procedure: CESAREAN SECTION;  Surgeon: Emily Filbert, MD;  Location: [facility];  Service: Obstetrics;  Laterality: N/A;  . CESAREAN SECTION MULTI-GESTATIONAL  10/08/2015   Procedure: CESAREAN SECTION MULTI-GESTATIONAL;  Surgeon: Osborne Oman, MD;  Location: [facility];  Service: Obstetrics;;    Prior to Admission  medications   Medication Sig Start Date End Date Taking? Authorizing Provider  Ferrous Sulfate (IRON) 325 (65 Fe) MG TABS Take 1 tablet (325 mg total) by mouth daily. 10/25/18   Rubie Maid, MD  ibuprofen (ADVIL) 800 MG tablet Take 1 tablet (800 mg total) by mouth every 8 (eight) hours as needed for moderate pain or cramping. 02/25/19   Anyanwu, Sallyanne Havers, MD  norethindrone (MICRONOR) 0.35 MG tablet Take 1 tablet (0.35 mg total) by mouth daily. Start after 2 weeks from delivery 02/25/19   Anyanwu, Sallyanne Havers, MD  oxyCODONE-acetaminophen (PERCOCET/ROXICET) 5-325 MG tablet Take 1 tablet by mouth every 4 (four) hours as needed for moderate pain or severe pain. 02/25/19   Anyanwu, Sallyanne Havers, MD  Prenatal Vit-Fe Fumarate-FA (PRENATAL MULTIVITAMIN) TABS tablet Take 1 tablet by mouth daily at 12 noon.    [provider]  senna-docusate (SENOKOT-S) 8.6-50 MG tablet Take 2 tablets by mouth daily as needed for mild constipation or moderate constipation. 02/25/19   Osborne Oman, MD    Allergies Patient has no known allergies.  Family History  Problem Relation Age of Onset  . Diabetes Mother   . Healthy Father   . Alcohol abuse Neg Hx   . Arthritis Neg Hx   . Asthma Neg Hx   . Birth defects Neg Hx   . Cancer Neg Hx   . COPD Neg Hx   . Depression Neg Hx   . Drug abuse Neg Hx   . Early death Neg Hx   . Hearing loss Neg Hx   . Heart disease Neg Hx   .  Hyperlipidemia Neg Hx   . Hypertension Neg Hx   . Kidney disease Neg Hx   . Learning disabilities Neg Hx   . Mental illness Neg Hx   . Mental retardation Neg Hx   . Miscarriages / Stillbirths Neg Hx   . Stroke Neg Hx   . Vision loss Neg Hx     Social History Social History   Tobacco Use  . Smoking status: Never Smoker  . Smokeless tobacco: Never Used  Substance Use Topics  . Alcohol use: No  . Drug use: No     Review of Systems  Constitutional: No fever/chills Eyes:  No discharge ENT: No upper respiratory  complaints. Respiratory: no cough. No SOB/ use of accessory muscles to breath Gastrointestinal:   No nausea, no vomiting.  No diarrhea.  No constipation. Musculoskeletal: Negative for musculoskeletal pain. Skin: Patient presents for wound check.  ____________________________________________   PHYSICAL EXAM:  VITAL SIGNS: ED Triage Vitals  Enc Vitals Group     BP 03/01/19 2226 123/81     Pulse Rate 03/01/19 2226 97     Resp 03/01/19 2226 18     Temp 03/01/19 2226 98.7 F (37.1 C)     Temp Source 03/01/19 2226 Oral     SpO2 03/01/19 2226 100 %     Weight 03/01/19 2227 169 lb (76.7 kg)     Height 03/01/19 2227 5\' 7"  (1.702 m)     Head Circumference --      Peak Flow --      Pain Score 03/01/19 2226 6     Pain Loc --      Pain Edu? --      Excl. in [place]? --      Constitutional: Alert and oriented. Well appearing and in no acute distress. Eyes: Conjunctivae are normal. PERRL. EOMI. Head: Atraumatic. Cardiovascular: Normal rate, regular rhythm. Normal S1 and S2.  Good peripheral circulation. Respiratory: Normal respiratory effort without tachypnea or retractions. Lungs CTAB. Good air entry to the bases with no decreased or absent breath sounds Gastrointestinal: Bowel sounds x 4 quadrants. Soft and nontender to palpation. No guarding or rigidity. No distention. Musculoskeletal: Full range of motion to all extremities. No obvious deformities noted Neurologic:  Normal for age. No gross focal neurologic deficits are appreciated.  Skin: Patient has a 3 cm region of wound dehiscence deep to underlying dermis.  No active bleeding. Psychiatric: Mood and affect are normal for age. Speech and behavior are normal.   ____________________________________________   LABS (all labs ordered are listed, but only abnormal results are displayed)  Labs Reviewed - No data to  display ____________________________________________  EKG   ____________________________________________  RADIOLOGY   No results found.  ____________________________________________    PROCEDURES  Procedure(s) performed:     Procedures  LACERATION REPAIR Performed by: Lannie Fields Authorized by: Lannie Fields Consent: Verbal consent obtained. Risks and benefits: risks, benefits and alternatives were discussed Consent given by: patient Patient identity confirmed: provided demographic data Prepped and Draped in normal sterile fashion Wound explored  Laceration Location: Incision site for c section at right abdomen.   Laceration Length: 3cm  No Foreign Bodies seen or palpated  Anesthesia: local infiltration  Skin closure: Dermabond   Patient tolerance: Patient tolerated the procedure well with no immediate complications.    Medications - No data to display   ____________________________________________   INITIAL IMPRESSION / ASSESSMENT AND PLAN / ED COURSE  Pertinent labs & imaging results that were available  during my care of the patient were reviewed by me and considered in my medical decision making (see chart for details).      Assessment and plan Wound check 34 year old female presents to the emergency department for a wound check.  Patient had a 3 cm region of dehiscence visualized that was repaired in the emergency department using Dermabond without complication.  She was advised to follow-up with her OB/GYN as needed.  Return precautions were given to return with new or worsening symptoms.  All patient questions were answered.   ____________________________________________  FINAL CLINICAL IMPRESSION(S) / ED DIAGNOSES  Final diagnoses:  Visit for wound check  Wound dehiscence      NEW MEDICATIONS STARTED DURING THIS VISIT:  ED Discharge Orders    None          This chart was dictated using voice recognition  software/Dragon. Despite best efforts to proofread, errors can occur which can change the meaning. Any change was purely unintentional.     Lannie Fields, PA-C 03/01/19 2319    Nance Pear, MD 03/01/19 2322

## 2019-03-01 NOTE — Telephone Encounter (Signed)
Called patient in regards to her questions about removing her dressing from her C/S. Advised patient that she can take off the outer bandage. Recommended that she take off after a shower. Reviewed with her that she has steri strips under the outer incision and they can come off as they loosen or can stay until her appt with the nurse.   Reviewed only allowing water to run over the incision with no rubbing to incision and to pat dry with a towel. Patient wanted to know if she should put any cream on the incision, informed her not to put anything on the incision unless directed by Physician.   Pt asking when she can return to work Reviewed usually 8 week after c/s but to wait until follow up with provider for decision to be made. Patient voiced understanding.   Patient with no further questions or concerns at this time.

## 2019-03-01 NOTE — Telephone Encounter (Signed)
Patient called in stating that she would like to speak to Dr. Kennon Rounds in regards to her c-section that she had on 2/13. Patient stated she would like to know when she can take off the dressing on her incision. Patient instructed that she can speak to a nurse about this matter and I would put in a message to them to give her a call back because they are currently busy. Patient verbalized understanding and message sent to clinical pool

## 2019-03-01 NOTE — ED Triage Notes (Signed)
Pt reports post op problem - interior flesh visible after dressing removed  Pt had therapeutic C-section on last Friday for poorly developing fetus (approx 12 weeks old), baby died on 05/04/22, pt removed dressing and wound opened, pt reports pain diminishing, and no blood at site, husband brought pt and pt appears stable and calm

## 2019-03-04 ENCOUNTER — Telehealth: Payer: Self-pay | Admitting: Family Medicine

## 2019-03-04 NOTE — Telephone Encounter (Signed)
I called [facility] and left a message I was returning her call - if you still need to talk with [place]; please call back and leave a message. Per chart went to ED because C/S wound opened and they closed it with dermabond.  Syrita Dovel,RN

## 2019-03-04 NOTE — Telephone Encounter (Signed)
The patient called in stating she visited the ER Saturday because her incision check opened. Advised the patient she has an appointment on Monday however also informed a message will be sent to the nursing station.

## 2019-03-05 ENCOUNTER — Telehealth (INDEPENDENT_AMBULATORY_CARE_PROVIDER_SITE_OTHER): Payer: BC Managed Care – PPO | Admitting: Family Medicine

## 2019-03-05 NOTE — Telephone Encounter (Signed)
Called patient in response to her earlier call.   Patient reports she has a c/s on Friday and Honeycomb was removed and replaced 2 days later. She removed the honeycomb dressing on Friday 2/19.  Patient reports she has not noticed steri strips. Patient reports she saw an open area and went to the ED. She reports the opened area was closed with glue (Dermabond). She is having some pain with standing up only. She denies redness, heat, drainage or smell to the incision.   She is to follow up with the office on Monday. Patient was advised to call if any redness, heat, drainage, or smell is noted prior to Monday. Patient voiced understanding.

## 2019-03-05 NOTE — Telephone Encounter (Signed)
The patient would like to speak with a nurse concerning the visit at the hospital regarding the incision.

## 2019-03-11 ENCOUNTER — Ambulatory Visit (INDEPENDENT_AMBULATORY_CARE_PROVIDER_SITE_OTHER): Payer: BC Managed Care – PPO | Admitting: *Deleted

## 2019-03-11 ENCOUNTER — Other Ambulatory Visit: Payer: Self-pay

## 2019-03-11 VITALS — BP 116/78 | HR 84 | Temp 98.9°F | Wt 174.1 lb

## 2019-03-11 DIAGNOSIS — Z4889 Encounter for other specified surgical aftercare: Secondary | ICD-10-CM

## 2019-03-11 NOTE — Progress Notes (Signed)
Pt presents for incision check. Per chart review, she was seen @ ED on 2/19 due to 3cm dehiscence of incision and Dermabond was applied. Per assessment, incision is dry and without swelling or redness. Small area (0.5 cm) on Rt side of incision remains open. Silver Nitrate applied to affected area by Dr. Kennon Rounds. Due to fetal loss, pt was advised that counseling is available if desired (PHQ-9 score today- 0, Gad-7 score- 3). Pt verbalized understanding and declined [facility] counseling at this time. She has PP appt scheduled in office on 3/24.

## 2019-03-11 NOTE — Progress Notes (Signed)
Patient seen and assessed by nursing staff.  Agree with documentation and plan.  

## 2019-03-25 ENCOUNTER — Ambulatory Visit: Payer: BC Managed Care – PPO | Admitting: Family Medicine

## 2019-03-26 ENCOUNTER — Encounter: Payer: Self-pay | Admitting: Advanced Practice Midwife

## 2019-03-26 DIAGNOSIS — Z029 Encounter for administrative examinations, unspecified: Secondary | ICD-10-CM

## 2019-04-03 ENCOUNTER — Encounter: Payer: Self-pay | Admitting: Family Medicine

## 2019-04-03 ENCOUNTER — Other Ambulatory Visit: Payer: Self-pay

## 2019-04-03 ENCOUNTER — Ambulatory Visit (INDEPENDENT_AMBULATORY_CARE_PROVIDER_SITE_OTHER): Payer: BC Managed Care – PPO | Admitting: Family Medicine

## 2019-04-03 VITALS — BP 115/73 | HR 89 | Wt 177.3 lb

## 2019-04-03 DIAGNOSIS — Z30011 Encounter for initial prescription of contraceptive pills: Secondary | ICD-10-CM

## 2019-04-03 MED ORDER — NORGESTIMATE-ETH ESTRADIOL 0.25-35 MG-MCG PO TABS: 1.0000 | ORAL_TABLET | Freq: Every day | ORAL | 3 refills | Status: AC

## 2019-04-03 NOTE — Progress Notes (Signed)
    [place] Partum Visit Note  Jordan Holt is a 34 y.o. female who presents for a postpartum visit. She is 5 weeks 4 days postpartum following a fundal vertical Cesarean section. I have fully reviewed the prenatal and intrapartum course. The delivery was at [facility]. Outcome: repeat cesarean section, classical incision. Anesthesia: local and spinal. Postpartum course has been normal. Baby's course has been neonatal demise.  Bleeding no bleeding. Bowel function is normal. Bladder function is normal. Patient is not sexually active. Contraception method is OCP (estrogen/progesterone). Postpartum depression screening: negative.  The following portions of the patient's history were reviewed and updated as appropriate: allergies, current medications, past family history, past medical history, past social history, past surgical history and problem list.  Review of Systems Pertinent items noted in HPI and remainder of comprehensive ROS otherwise negative.    Objective:  Blood pressure 115/73, pulse 89, weight 177 lb 4.8 oz (80.4 kg), not currently breastfeeding.  General:  alert, cooperative and appears stated age  Lungs: normal effort  Heart:  regular rate and rhythm  Abdomen: soft, non-tender; bowel sounds normal; no masses,  no organomegaly incision is well healed        Assessment:    Normal postpartum exam. Pap smear not done at today's visit.   Plan:   Essential components of care per ACOG recommendations:  1.  Mood and well being: Patient with negative depression screening today. Reviewed local resources for support.  - Patient does not use tobacco.  - hx of drug use? No    2. Sexuality, contraception and birth spacing - Patient does not know want a pregnancy in the next year given neonatal demise. We discussed coming in for discussion prior to next conception.  - Reviewed forms of contraception in tiered fashion. Patient desired oral contraceptives (estrogen/progesterone) today.   -  Discussed birth spacing of 18 months  3. Sleep and fatigue -Encouraged family/partner/community support of 4 hrs of uninterrupted sleep to help with mood and fatigue  4. Physical Recovery  . Patient expressed understanding - Patient has urinary incontinence? No - Patient is safe to resume physical and sexual activity  5.  Health Maintenance - Last pap smear done 10/2018 and was abnormal with ASCUS with negative HPV.   6. Chronic Disease - does not have any - PCP follow up  Donnamae Jude, [place] for [place], [place]

## 2019-11-19 ENCOUNTER — Encounter: Payer: Self-pay | Admitting: *Deleted

## 2021-05-23 IMAGING — US US MFM UA CORD DOPPLER
1 series · 15 of 28 positions shown · non-contrast
Comparison: none

[Series 1: us mfm ua cord doppler · 15 of 98 slices shown]
[im 1/98]
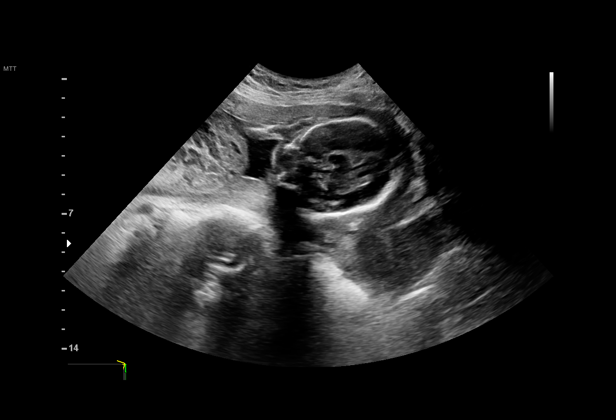
[im 8/98]
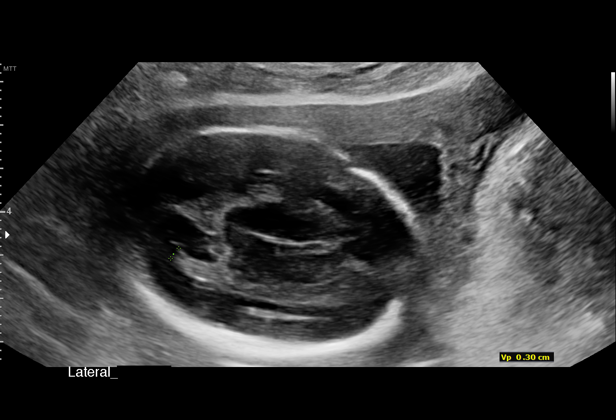
[im 15/98]
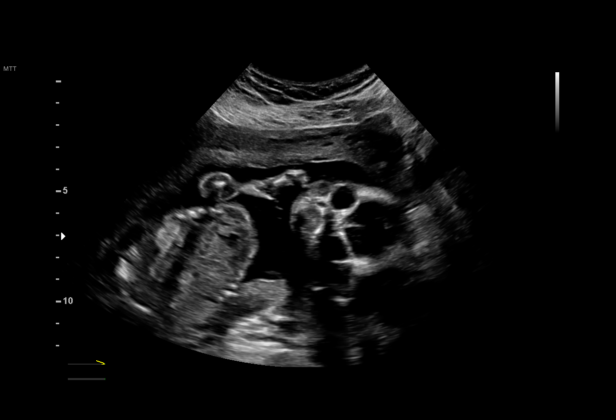
[im 22/98]
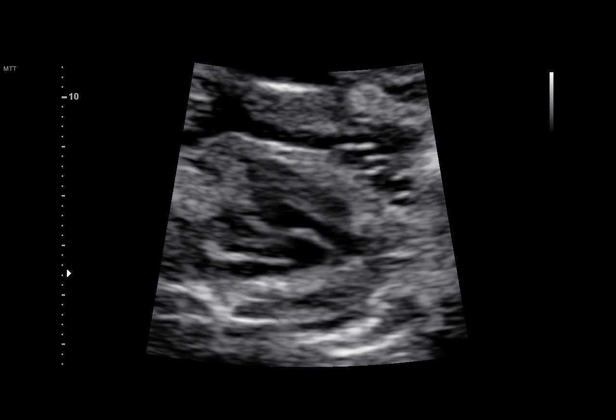
[im 29/98]
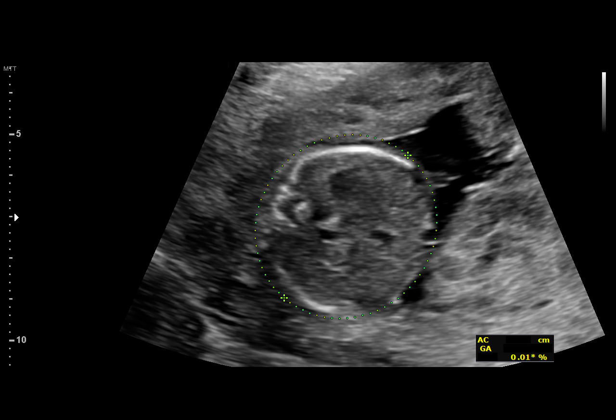
[im 36/98]
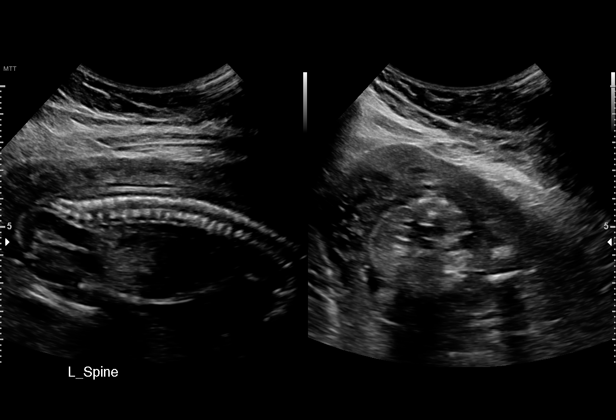
[im 44/98]
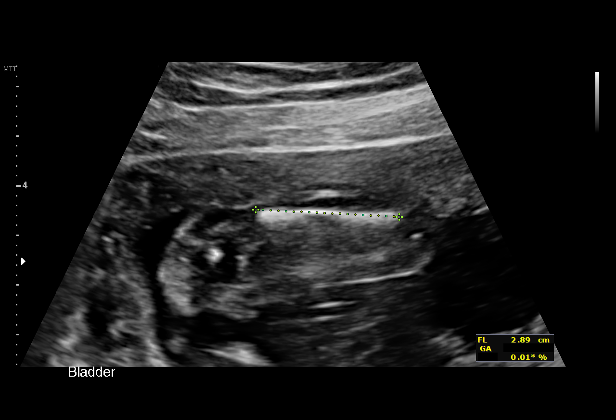
[im 51/98]
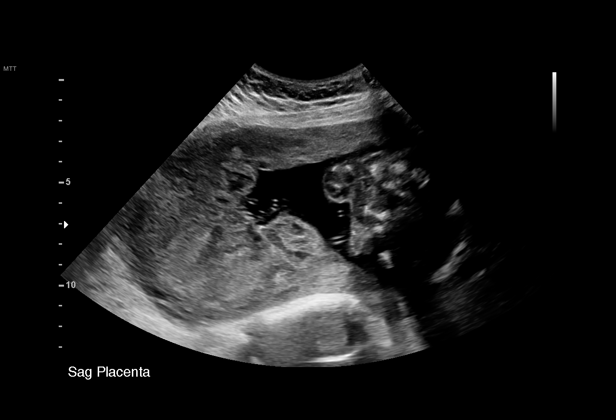
[im 54/98]
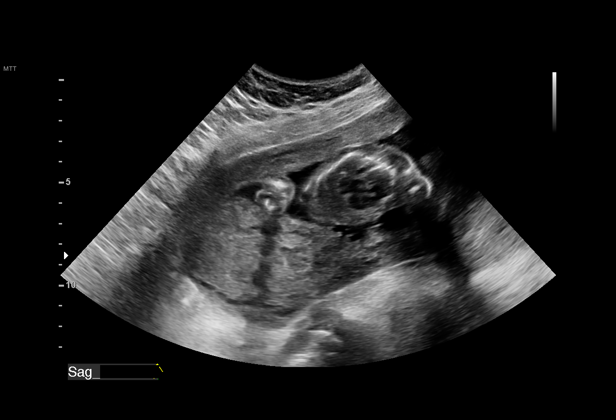
[im 62/98]
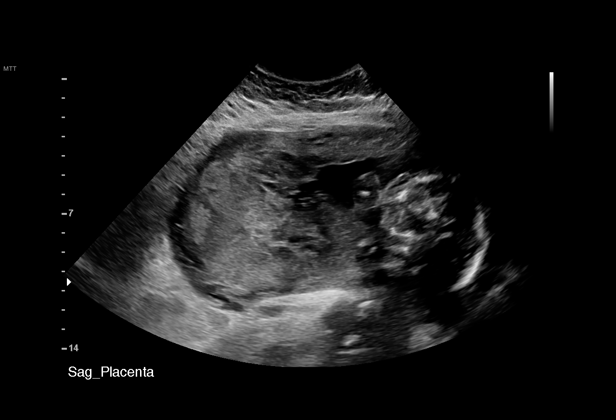
[im 69/98]
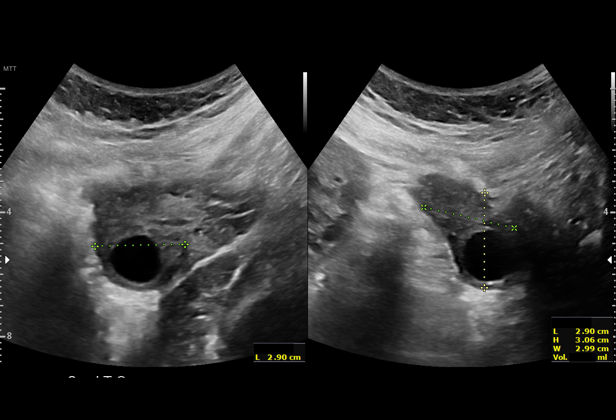
[im 76/98]
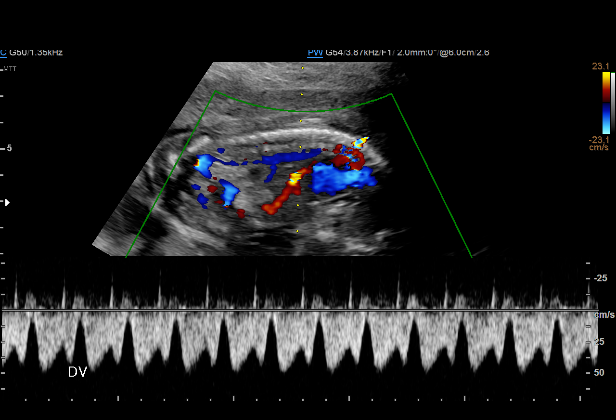
[im 83/98]
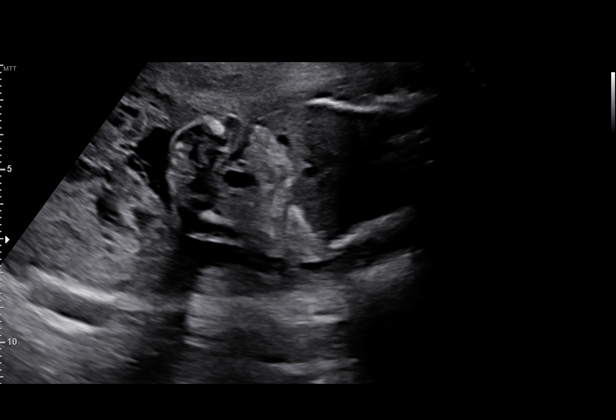
[im 90/98]
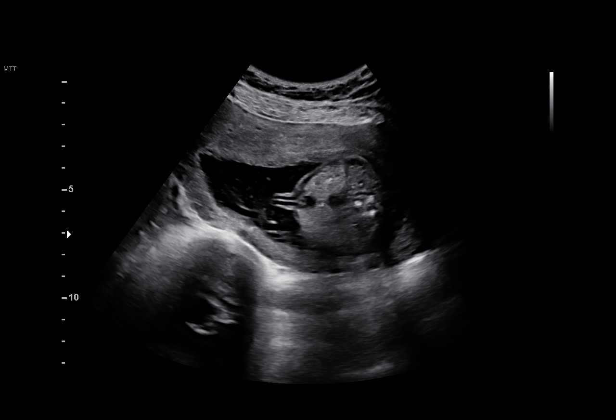
[im 98/98]
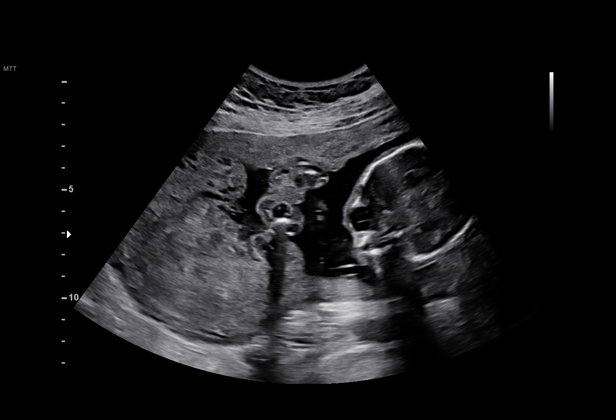

[15 of 28 positions shown; findings below may reference images not displayed]

[address]

 ----------------------------------------------------------------------

 ----------------------------------------------------------------------
Indications

  Maternal care for known or suspected poor
  fetal growth, second trimester, not applicable
  or unspecified IUGR
  26 weeks gestation of pregnancy
  Antenatal screening for malformations (low
  risk NIPS, neg AFP)
  Fetal abnormality - other known or
  suspected (echogenic bowel)
  Uterine fibroids affecting pregnancy in        O34.12,
  second trimester, antepartum
  Poor obstetric history: Previous preterm
  delivery, antepartum (36 wk twins)
  Previous cesarean delivery, antepartum
  Poor obstetric history: Previous
  preeclampsia / eclampsia/gestational HTN
 ----------------------------------------------------------------------
Fetal Evaluation

 Num Of Fetuses:         1
 Fetal Heart Rate(bpm):  146
 Cardiac Activity:       Observed
 Presentation:           Cephalic
 Placenta:               Fundal
 P. Cord Insertion:      Visualized, central

 Amniotic Fluid
 AFI FV:      Subjectively low-normal

                             Largest Pocket(cm)


 Comment:    Placenta appears thickened 5.8 cm.
Biometry

 BPD:      48.8  mm     G. Age:  20w 5d        < 1  %    CI:        68.97   %    70 - 86
                                                         FL/HC:      15.0   %    18.6 -
 HC:      187.7  mm     G. Age:  21w 1d        < 1  %    HC/AC:      1.35        1.04 -
 AC:      139.1  mm     G. Age:  19w 2d        < 1  %    FL/BPD:     57.8   %    71 - 87
 FL:       28.2  mm     G. Age:  18w 4d        < 1  %    FL/AC:      20.3   %    20 - 24
 CER:        27  mm     G. Age:  24w 4d         20  %
 LV:          3  mm

 Est. FW:     282  gm    0 lb 10 oz     < 1  %
OB History

 Gravidity:    3         Term:   1        Prem:   1        SAB:   0
 TOP:          0       Ectopic:  0        Living: 3
Gestational Age

 LMP:           26w 0d        Date:  07/23/18                 EDD:   04/29/19
 U/S Today:     20w 0d                                        EDD:   06/10/19
 Best:          26w 0d     Det. By:  LMP  (07/23/18)          EDD:   04/29/19
Anatomy

 Cranium:               Appears normal         LVOT:                   Appears normal
 Cavum:                 Appears normal         Aortic Arch:            Appears normal
 Ventricles:            Appears normal         Ductal Arch:            Previously seen
 Choroid Plexus:        Previously seen        Diaphragm:              Appears normal
 Cerebellum:            Appears normal         Stomach:                Appears normal, left
                                                                       sided
 Posterior Fossa:       Previously seen        Abdomen:                Echogenic Bowel
 Nuchal Fold:           Not applicable (>20    Abdominal Wall:         Appears nml (cord
                        wks GA)                                        insert, abd wall)
 Face:                  Orbits and profile     Cord Vessels:           Previously seen
                        previously seen
 Lips:                  Previously seen        Kidneys:                Appear normal
 Palate:                Previously seen        Bladder:                Appears normal
 Thoracic:              Abnormal, see          Spine:                  Limited views
                        comments
                                                                       appear normal
 Heart:                 Appears normal         Upper Extremities:      Previously seen
                        (4CH, axis, and
                        situs)
 RVOT:                  Previously seen        Lower Extremities:      Previously seen

 Other:  Heels/feet and open hands/5th digits visualized previously.  Nasal
         bone visualized previously.
Doppler - Fetal Vessels

 Umbilical Artery
                                                           ADFV    RDFV
                                                             Yes      No

Cervix Uterus Adnexa
 Cervix
 Not visualized (advanced GA >02wks)

 Uterus
 Single fibroid noted, see table below.

 Left Ovary
 Within normal limits. No adnexal mass visualized.

 Right Ovary
 Within normal limits. No adnexal mass visualized.

 Cul De Sac
 No free fluid seen.

 Adnexa
 No abnormality visualized.
Myomas

  Site                     L(cm)      W(cm)      D(cm)      Location
  Anterior                 1.82       1.37       1.88       Subserosal
 ----------------------------------------------------------------------

  Blood Flow                 RI        PI       Comments

 ----------------------------------------------------------------------
Comments

 This patient was seen for a follow-up exam due to fetal
 growth restriction.  Absent end-diastolic flow was noted on
 the umbilical artery Doppler studies last week.  The patient
 reports feeling fetal movements throughout the day.  She
 denies any problems since her last exam.  Her blood
 pressure today was 125/78.

 Severe fetal growth restriction with an overall EFW
 measuring less than the 1st percentile for her gestational age
 continues to be noted.  The fetus only grew about 2 ounces
 over the past 2 weeks.

 There was normal amniotic fluid noted today.  Fetal
 movements were noted throughout today's ultrasound exam.
 A small placenta that was globular in shape with multiple
 lakes/lacunae was noted.

 Echogenic bowel was noted in the fetal abdomen.  The
 patient was advised that this is a common finding in severely
 growth restricted fetuses.

 Doppler studies of the umbilical arteries performed due to
 fetal growth restriction continues to show absent end-diastolic
 flow.  There were no signs of reversed end-diastolic flow.

 The increased risk of a fetal demise due to the lack of fetal
 growth and the abnormal umbilical artery Doppler studies
 was discussed with the patient and her husband via
 telephone.  They were advised that based on the EFW
 obtained today (282 g), the fetus is not considered viable.  A
 fetus is usually considered viable when the EFW is between
 400 to 500 g.

 Due to the increased risk of a fetal demise, another
 ultrasound for assessment of the umbilical artery Doppler
 studies was scheduled at the end of this week.  Should
 reversed end-diastolic flow be noted at that time,
 hospitalization and the administration of a complete course of
 antenatal corticosteroids will be considered at that time.  The
 patient and her husband were encouraged to think about
 whether they would want to have a classical cesarean
 delivery which may have ramifications for her future
 pregnancies,  for delivery of a fetus that may or may not
 survive after delivery due to the significantly small size.  They
 understand that delivery, when the time comes, will be the
 only way to prevent a fetal demise.  We will readdress this
 issue when she returns for her next ultrasound later this
 week.  I encouraged her husband to be present at her next
 ultrasound exam.

 The increased risk of developing preeclampsia due to
 abnormal placental function was discussed today.  The signs
 and symptoms of preeclampsia were reviewed.  She was
 advised to come to the hospital should she develop any signs
 or symptoms of severe preeclampsia.

 Another ultrasound was scheduled in 4 days.

 A total of 15 minutes was spent counseling and coordinating
 the care for this patient.  Greater than 50% of the time was
 spent in direct face-to-face contact.
